# Patient Record
Sex: Female | Born: 1985 | Hispanic: Yes | Smoking: Never smoker
Health system: Southern US, Community
[De-identification: ages and names within clinical notes are randomized; demographics above are authoritative.]

## PROBLEM LIST (undated history)

## (undated) DIAGNOSIS — K219 Gastro-esophageal reflux disease without esophagitis: Secondary | ICD-10-CM

## (undated) DIAGNOSIS — N12 Tubulo-interstitial nephritis, not specified as acute or chronic: Secondary | ICD-10-CM

## (undated) DIAGNOSIS — K529 Noninfective gastroenteritis and colitis, unspecified: Secondary | ICD-10-CM

## (undated) DIAGNOSIS — K2970 Gastritis, unspecified, without bleeding: Secondary | ICD-10-CM

## (undated) HISTORY — DX: Gastritis, unspecified, without bleeding: K29.70

## (undated) HISTORY — PX: NO PAST SURGERIES: SHX2092

## (undated) HISTORY — DX: Noninfective gastroenteritis and colitis, unspecified: K52.9

## (undated) HISTORY — DX: Gastro-esophageal reflux disease without esophagitis: K21.9

## (undated) HISTORY — DX: Tubulo-interstitial nephritis, not specified as acute or chronic: N12

---

## 2014-12-12 ENCOUNTER — Ambulatory Visit: Payer: Self-pay | Attending: Internal Medicine | Admitting: Internal Medicine

## 2014-12-12 ENCOUNTER — Encounter: Payer: Self-pay | Admitting: Internal Medicine

## 2014-12-12 VITALS — BP 102/68 | HR 71 | Temp 97.9°F | Resp 16 | Ht 61.0 in | Wt 107.0 lb

## 2014-12-12 DIAGNOSIS — Z309 Encounter for contraceptive management, unspecified: Secondary | ICD-10-CM

## 2014-12-12 DIAGNOSIS — Z3009 Encounter for other general counseling and advice on contraception: Secondary | ICD-10-CM | POA: Insufficient documentation

## 2014-12-12 DIAGNOSIS — Z0000 Encounter for general adult medical examination without abnormal findings: Secondary | ICD-10-CM | POA: Insufficient documentation

## 2014-12-12 LAB — LIPID PANEL
CHOLESTEROL: 119 mg/dL (ref 0–200)
HDL: 60 mg/dL (ref >39)
LDL Cholesterol: 48 mg/dL (ref 0–99)
Total CHOL/HDL Ratio: 2 Ratio
Triglycerides: 54 mg/dL (ref <150)
VLDL: 11 mg/dL (ref 0–40)

## 2014-12-12 LAB — COMPLETE METABOLIC PANEL WITH GFR
ALT: 33 U/L (ref 0–35)
AST: 30 U/L (ref 0–37)
Albumin: 4.4 g/dL (ref 3.5–5.2)
Alkaline Phosphatase: 53 U/L (ref 39–117)
BUN: 7 mg/dL (ref 6–23)
CHLORIDE: 102 meq/L (ref 96–112)
CO2: 27 mEq/L (ref 19–32)
Calcium: 9.3 mg/dL (ref 8.4–10.5)
Creat: 0.52 mg/dL (ref 0.50–1.10)
GFR, Est Non African American: >89 mL/min
GLUCOSE: 74 mg/dL (ref 70–99)
Potassium: 4.2 mEq/L (ref 3.5–5.3)
Sodium: 137 mEq/L (ref 135–145)
Total Bilirubin: 0.7 mg/dL (ref 0.2–1.2)
Total Protein: 7.3 g/dL (ref 6.0–8.3)

## 2014-12-12 LAB — POCT URINALYSIS DIPSTICK
BILIRUBIN UA: NEGATIVE
GLUCOSE UA: NEGATIVE
Ketones, UA: 15
Leukocytes, UA: NEGATIVE
NITRITE UA: NEGATIVE
PROTEIN UA: NEGATIVE
SPEC GRAV UA: 1.015
UROBILINOGEN UA: 0.2
pH, UA: 6.5

## 2014-12-12 LAB — CBC
HEMATOCRIT: 35.4 % — AB (ref 36.0–46.0)
HEMOGLOBIN: 12.1 g/dL (ref 12.0–15.0)
MCH: 29.7 pg (ref 26.0–34.0)
MCHC: 34.2 g/dL (ref 30.0–36.0)
MCV: 87 fL (ref 78.0–100.0)
MPV: 9.9 fL (ref 8.6–12.4)
PLATELETS: 248 10*3/uL (ref 150–400)
RBC: 4.07 MIL/uL (ref 3.87–5.11)
RDW: 13.4 % (ref 11.5–15.5)
WBC: 4.6 10*3/uL (ref 4.0–10.5)

## 2014-12-12 LAB — POCT URINE PREGNANCY: Preg Test, Ur: NEGATIVE

## 2014-12-12 MED ORDER — MEDROXYPROGESTERONE ACETATE 150 MG/ML IM SUSP
150.0000 mg | Freq: Once | INTRAMUSCULAR | Status: AC
  Administered 2014-12-12: 150 mg via INTRAMUSCULAR

## 2014-12-12 NOTE — Progress Notes (Signed)
Patient ID: Lisa Lin, female   DOB: 1986-01-30, 29 y.o.   MRN: [id number]  NID:[id number]  RWE:[id number]  DOB - Dec 16, 1985  CC:  Chief Complaint  Patient presents with  . Establish Care       HPI: Lisa Lin is a 29 y.o. female here today to establish medical care.  Patient has no past medical history.  She currently has three children.  She reports that her last period was the middle of December and it is irregular.  She reports that her last pap smear was in [place] last year and it was abnormal, in which her Colpo came back negative. Has history of ovarian cyst. She was told she needs to have pap smears every 6 months but she has not followed up since then.    Surgical Hx: none Social Hx: denies tobacco, alcohol, and drug use.  Unmarried with 3 childrens.   Patient has No headache, No chest pain, No abdominal pain - No Nausea, No new weakness tingling or numbness, No Cough - SOB.  Not on File History reviewed. No pertinent past medical history. No current outpatient prescriptions on file prior to visit.   No current facility-administered medications on file prior to visit.   History reviewed. No pertinent family history. History   Social History  . Marital Status: Single    Spouse Name: N/A    Number of Children: N/A  . Years of Education: N/A   Occupational History  . Not on file.   Social History Main Topics  . Smoking status: Never Smoker   . Smokeless tobacco: Not on file  . Alcohol Use: Not on file  . Drug Use: Not on file  . Sexual Activity: Not on file   Other Topics Concern  . Not on file   Social History Narrative  . No narrative on file    Review of Systems: Constitutional: Negative for fever, chills, diaphoresis, activity change, appetite change and fatigue. HENT: Negative for ear pain, nosebleeds, congestion, facial swelling, rhinorrhea, neck pain, neck stiffness and ear discharge.  Eyes: Negative for pain, discharge, redness, itching  and visual disturbance. Respiratory: Negative for cough, choking, chest tightness, shortness of breath, wheezing and stridor.  Cardiovascular: Negative for chest pain, palpitations and leg swelling. Gastrointestinal: Negative for abdominal distention. Genitourinary: Negative for dysuria, urgency, frequency, hematuria, flank pain, decreased urine volume, difficulty urinating and dyspareunia.  Musculoskeletal: Negative for back pain, joint swelling, arthralgia and gait problem. Neurological: Negative for dizziness, tremors, seizures, syncope, facial asymmetry, speech difficulty, weakness, light-headedness, numbness and headaches.  Hematological: Negative for adenopathy. Does not bruise/bleed easily. Psychiatric/Behavioral: Negative for hallucinations, behavioral problems, confusion, dysphoric mood, decreased concentration and agitation.    Objective:   Filed Vitals:   12/12/14 1029  BP: 102/68  Pulse: 71  Temp: 97.9 F (36.6 C)  Resp: 16    Physical Exam: Constitutional: Patient appears well-developed and well-nourished. No distress. HENT: Normocephalic, atraumatic, External right and left ear normal. Oropharynx is clear and moist.  Eyes: Conjunctivae and EOM are normal. PERRLA, no scleral icterus. Neck: Normal ROM. Neck supple. No JVD. No tracheal deviation. No thyromegaly. CVS: RRR, S1/S2 +, no murmurs, no gallops, no carotid bruit.  Pulmonary: Effort and breath sounds normal, no stridor, rhonchi, wheezes, rales.  Abdominal: Soft. BS +, no distension, tenderness, rebound or guarding.  Musculoskeletal: Normal range of motion. No edema and no tenderness.  Neuro: Alert. Normal reflexes, muscle tone coordination. No cranial nerve deficit. Skin: Skin is warm and dry. No  rash noted. Not diaphoretic. No erythema. No pallor. Psychiatric: Normal mood and affect. Behavior, judgment, thought content normal. Pelvic: cervix normal in appearance, external genitalia normal, no adnexal masses or  tenderness, no cervical motion tenderness and vagina normal without discharge  Breasts: normal appearance, no masses or tenderness, No nipple discharge or bleeding, No axillary or supraclavicular adenopathy    No results found for: WBC, HGB, HCT, MCV, PLT No results found for: CREATININE, BUN, NA, K, CL, CO2  No results found for: HGBA1C Lipid Panel  No results found for: CHOL, TRIG, HDL, CHOLHDL, VLDL, LDLCALC     Assessment and plan:   Rosabell was seen today for establish care.  Diagnoses and associated orders for this visit:  Annual physical exam - POCT urinalysis dipstick - Cytology - PAP [place] - Cervicovaginal ancillary only - Lipid panel - COMPLETE METABOLIC PANEL WITH GFR - CBC - TSH - Vitamin D, 25-hydroxy  Counseling for initiation of birth control method - POCT urine pregnancy - Begin medroxyPROGESTERone (DEPO-PROVERA) injection 150 mg; Inject 1 mL (150 mg total) into the muscle once.  Due to language barrier, an interpreter was present during the history-taking and subsequent discussion (and for part of the physical exam) with this patient.   Return for b/w march 26-april 9 for repeat Depo injection--RN visit.    Chari Manning, [facility] [phone] 12/12/2014, 10:59 AM

## 2014-12-12 NOTE — Patient Instructions (Signed)

## 2014-12-12 NOTE — Progress Notes (Signed)
Patient is here to establish care Patient is interested in birth control Pap smear last year was abnormal-no follow up was done this was in [place] Patient has 3 children Has had flu shot Is fasting

## 2014-12-13 LAB — VITAMIN D 25 HYDROXY (VIT D DEFICIENCY, FRACTURES): VIT D 25 HYDROXY: 11 ng/mL — AB (ref 30–100)

## 2014-12-13 LAB — TSH: TSH: 1.092 u[IU]/mL (ref 0.350–4.500)

## 2014-12-15 LAB — CYTOLOGY - PAP

## 2014-12-15 LAB — CERVICOVAGINAL ANCILLARY ONLY
Chlamydia: NEGATIVE
Neisseria Gonorrhea: NEGATIVE
WET PREP (BD AFFIRM): NEGATIVE
Wet Prep (BD Affirm): NEGATIVE
Wet Prep (BD Affirm): NEGATIVE

## 2014-12-25 ENCOUNTER — Telehealth: Payer: Self-pay | Admitting: *Deleted

## 2014-12-25 MED ORDER — ERGOCALCIFEROL 1.25 MG (50000 UT) PO CAPS: 50000.0000 [IU] | ORAL_CAPSULE | ORAL | Status: DC

## 2014-12-25 NOTE — Telephone Encounter (Signed)
-----   Message from Lance Bosch, NP sent at 12/23/2014 10:52 PM EST ----- Patient pap is negative for malignancies and infections. Will repeat in 3 years.  Labs normal except for Vitamin D is low. Please send drisdol 50,000 IU to take once weekly for 12 weeks. 12 tablets no refills.

## 2014-12-25 NOTE — Telephone Encounter (Signed)
Pt aware of resutls. Rx send to [facility] (information was given in [place])

## 2014-12-28 ENCOUNTER — Encounter: Payer: Self-pay | Admitting: Internal Medicine

## 2015-03-06 ENCOUNTER — Ambulatory Visit: Payer: Self-pay | Attending: Internal Medicine | Admitting: Internal Medicine

## 2015-03-06 DIAGNOSIS — N912 Amenorrhea, unspecified: Secondary | ICD-10-CM | POA: Insufficient documentation

## 2015-03-06 DIAGNOSIS — Z3009 Encounter for other general counseling and advice on contraception: Secondary | ICD-10-CM | POA: Insufficient documentation

## 2015-03-06 LAB — POCT URINE PREGNANCY: PREG TEST UR: NEGATIVE

## 2015-03-06 MED ORDER — MEDROXYPROGESTERONE ACETATE 150 MG/ML IM SUSP
150.0000 mg | Freq: Once | INTRAMUSCULAR | Status: AC
  Administered 2015-03-06: 150 mg via INTRAMUSCULAR

## 2015-03-06 NOTE — Progress Notes (Signed)
Spoke with patient via [employer], [place], [place] Patient presents for Depo Provera injection States feeling well Last injection received 12/12/14 Patient concerned that she has not had a menses since beginning depo and requesting urine pregnancy test Discussed with pt that amenorrhea is a common side effect to depo  Urine pregnancy: negative  Next injection due 6/17-06/05/2015

## 2015-03-24 ENCOUNTER — Encounter: Payer: Self-pay | Admitting: Internal Medicine

## 2015-03-24 NOTE — Progress Notes (Signed)
I have read and agree with nursing assessment and plan  Chari Manning, NP 03/24/2015 1:14 PM

## 2015-05-22 ENCOUNTER — Ambulatory Visit: Payer: Self-pay | Attending: Internal Medicine | Admitting: *Deleted

## 2015-05-22 VITALS — Wt 113.4 lb

## 2015-05-22 DIAGNOSIS — Z308 Encounter for other contraceptive management: Secondary | ICD-10-CM | POA: Insufficient documentation

## 2015-05-22 MED ORDER — MEDROXYPROGESTERONE ACETATE 150 MG/ML IM SUSP
150.0000 mg | Freq: Once | INTRAMUSCULAR | Status: AC
  Administered 2015-05-22: 150 mg via INTRAMUSCULAR

## 2015-05-22 NOTE — Progress Notes (Signed)
Patient presents for Depo Provera injection States feeling well Last injection received 03/06/15 Next injection due 9/2 - 08/21/2015 Patient concerned about weight gain since starting Depo Provera Patient considering alternative contraception due to weight gain Todays' WT 113.4 lb WT 107 on 12/12/14 Encouraged patient to schedule appt with PCP to discuss options

## 2015-06-22 ENCOUNTER — Ambulatory Visit: Payer: Self-pay | Attending: Internal Medicine

## 2015-08-07 ENCOUNTER — Ambulatory Visit: Payer: Self-pay

## 2015-08-27 ENCOUNTER — Ambulatory Visit: Payer: Self-pay | Attending: Internal Medicine

## 2015-08-27 DIAGNOSIS — Z30013 Encounter for initial prescription of injectable contraceptive: Secondary | ICD-10-CM | POA: Insufficient documentation

## 2015-08-27 LAB — POCT URINE PREGNANCY: PREG TEST UR: NEGATIVE

## 2015-08-27 MED ORDER — MEDROXYPROGESTERONE ACETATE 150 MG/ML IM SUSP
150.0000 mg | Freq: Once | INTRAMUSCULAR | Status: AC
  Administered 2015-08-27: 150 mg via INTRAMUSCULAR

## 2015-08-27 NOTE — Progress Notes (Signed)
Patient here for her depo injection Last injection was 6/17 Patient missed the window date to return so per Protocol we did a urine pregnancy which was negative Here in the office Patient to return between 12/8 and 12/22 for her next injection

## 2015-08-27 NOTE — Patient Instructions (Signed)
Return between December 8 and December 22 for your next depo injection

## 2015-11-13 ENCOUNTER — Ambulatory Visit: Payer: Self-pay | Attending: Internal Medicine | Admitting: *Deleted

## 2015-11-13 DIAGNOSIS — Z309 Encounter for contraceptive management, unspecified: Secondary | ICD-10-CM | POA: Insufficient documentation

## 2015-11-13 MED ORDER — MEDROXYPROGESTERONE ACETATE 150 MG/ML IM SUSP
150.0000 mg | Freq: Once | INTRAMUSCULAR | Status: AC
  Administered 2015-11-13: 150 mg via INTRAMUSCULAR

## 2015-11-13 NOTE — Progress Notes (Signed)
Patient here for her depo injection Last injection was 08/27/2015 Pt within window (12/8 and 12/22) Per protocol Depo given today. Advised to return between Jan 28, 2015-March 10,2017

## 2015-11-13 NOTE — Patient Instructions (Signed)
Return between Jan 28, 2015-March 10,2017

## 2015-12-22 ENCOUNTER — Ambulatory Visit: Payer: Self-pay | Attending: Internal Medicine

## 2016-02-19 ENCOUNTER — Ambulatory Visit: Payer: Self-pay | Attending: Internal Medicine

## 2016-02-19 VITALS — BP 122/78 | HR 70 | Temp 98.0°F | Resp 16

## 2016-02-19 DIAGNOSIS — Z3042 Encounter for surveillance of injectable contraceptive: Secondary | ICD-10-CM

## 2016-02-19 LAB — POCT URINE PREGNANCY: Preg Test, Ur: NEGATIVE

## 2016-02-19 MED ORDER — MEDROXYPROGESTERONE ACETATE 150 MG/ML IM SUSP
150.0000 mg | Freq: Once | INTRAMUSCULAR | Status: AC
  Administered 2016-02-19: 150 mg via INTRAMUSCULAR

## 2016-02-19 NOTE — Patient Instructions (Signed)
Return back to office for your next Depo shot between June 2nd and June 16

## 2016-02-19 NOTE — Progress Notes (Unsigned)
Patient here for her next depo injection Patient was supposed to return  Between 2/24 and 3/10 Patient has missed the return window Per office policy we will run a urine pregnancy test

## 2016-05-13 ENCOUNTER — Ambulatory Visit: Payer: Self-pay | Attending: Internal Medicine

## 2016-05-13 DIAGNOSIS — Z309 Encounter for contraceptive management, unspecified: Secondary | ICD-10-CM

## 2016-05-13 MED ORDER — MEDROXYPROGESTERONE ACETATE 150 MG/ML IM SUSP
150.0000 mg | Freq: Once | INTRAMUSCULAR | Status: AC
  Administered 2016-05-13: 150 mg via INTRAMUSCULAR

## 2016-05-13 NOTE — Patient Instructions (Addendum)
La proxima Inj Depo Porvera debe de ser Lisa Lin 27 a Setiembre 7 Next Depo Provera Inj can be done between Aug 25- Sep 8

## 2016-05-13 NOTE — Progress Notes (Signed)
Patient here for her next depo injection Last Depo Inj done on February 19 2016 Pt within window  Depo given today without negative reaction

## 2016-07-20 ENCOUNTER — Ambulatory Visit: Payer: Self-pay | Attending: Internal Medicine

## 2016-08-16 ENCOUNTER — Ambulatory Visit: Payer: Self-pay | Attending: Internal Medicine

## 2016-08-16 DIAGNOSIS — Z3049 Encounter for surveillance of other contraceptives: Secondary | ICD-10-CM

## 2016-08-16 LAB — POCT URINE PREGNANCY: PREG TEST UR: NEGATIVE

## 2016-08-16 MED ORDER — MEDROXYPROGESTERONE ACETATE 150 MG/ML IM SUSP
150.0000 mg | Freq: Once | INTRAMUSCULAR | Status: AC
  Administered 2016-08-16: 150 mg via INTRAMUSCULAR

## 2016-08-16 NOTE — Patient Instructions (Signed)
Next injection due Nov 28-2017 to Nov 15, 2016

## 2016-08-16 NOTE — Progress Notes (Signed)
Patient her for Depo Injection.  [facility] Interpreter ID lorena [phone] assisted with translation. Patient here today for Depo Provera.  Received last Depo on 05/13/16 and is due for Depo today.  Depo Provera 150 mg given in RUOQ.  Due for next injection Nov 28 to November 15, 2016 .  Reminder card given to patient.  Priscille Heidelberg, RN, BSN

## 2016-11-07 ENCOUNTER — Ambulatory Visit: Payer: Self-pay | Attending: Internal Medicine | Admitting: *Deleted

## 2016-11-07 DIAGNOSIS — Z3042 Encounter for surveillance of injectable contraceptive: Secondary | ICD-10-CM

## 2016-11-07 MED ORDER — MEDROXYPROGESTERONE ACETATE 150 MG/ML IM SUSP
150.0000 mg | Freq: Once | INTRAMUSCULAR | Status: AC
  Administered 2016-11-07: 150 mg via INTRAMUSCULAR

## 2016-11-07 NOTE — Progress Notes (Signed)
Pt presents [facility] for Depo Injection. Administered Lt upper outer quadrant.

## 2017-01-06 ENCOUNTER — Ambulatory Visit: Payer: Self-pay | Attending: Internal Medicine

## 2017-02-01 ENCOUNTER — Ambulatory Visit: Payer: Self-pay | Attending: Internal Medicine | Admitting: *Deleted

## 2017-02-01 VITALS — BP 133/80

## 2017-02-01 DIAGNOSIS — Z3042 Encounter for surveillance of injectable contraceptive: Secondary | ICD-10-CM | POA: Insufficient documentation

## 2017-02-01 MED ORDER — MEDROXYPROGESTERONE ACETATE 150 MG/ML IM SUSP
150.0000 mg | Freq: Once | INTRAMUSCULAR | Status: AC
  Administered 2017-02-01: 150 mg via INTRAMUSCULAR

## 2017-02-01 NOTE — Progress Notes (Signed)
Date last pap: 12/12/14 . Last Depo-Provera:11/07/2016  Side Effects if any: none. Serum HCG indicated? no. Depo-Provera 150 mg IM given in Right upper outer quadrant by T.Maclain Cohron,RN. Pt tolerated procedure well. Next appointment due May 16 through May 30. Given reminder card. Pt verbalized understanding. Carilyn Goodpasture,  RN

## 2017-02-09 ENCOUNTER — Ambulatory Visit: Payer: Self-pay | Attending: Family Medicine | Admitting: Family Medicine

## 2017-02-09 VITALS — BP 106/71 | HR 83 | Temp 98.2°F | Resp 18 | Ht 61.0 in | Wt 110.6 lb

## 2017-02-09 DIAGNOSIS — K219 Gastro-esophageal reflux disease without esophagitis: Secondary | ICD-10-CM | POA: Insufficient documentation

## 2017-02-09 DIAGNOSIS — Z7689 Persons encountering health services in other specified circumstances: Secondary | ICD-10-CM | POA: Insufficient documentation

## 2017-02-09 DIAGNOSIS — Z79899 Other long term (current) drug therapy: Secondary | ICD-10-CM | POA: Insufficient documentation

## 2017-02-09 DIAGNOSIS — R1013 Epigastric pain: Secondary | ICD-10-CM | POA: Insufficient documentation

## 2017-02-09 DIAGNOSIS — R1310 Dysphagia, unspecified: Secondary | ICD-10-CM | POA: Insufficient documentation

## 2017-02-09 LAB — CBC WITH DIFFERENTIAL/PLATELET
BASOS PCT: 0 %
Basophils Absolute: 0 cells/uL (ref 0–200)
EOS PCT: 2 %
Eosinophils Absolute: 108 cells/uL (ref 15–500)
HCT: 39.8 % (ref 35.0–45.0)
HEMOGLOBIN: 13.5 g/dL (ref 11.7–15.5)
LYMPHS ABS: 1620 {cells}/uL (ref 850–3900)
Lymphocytes Relative: 30 %
MCH: 31 pg (ref 27.0–33.0)
MCHC: 33.9 g/dL (ref 32.0–36.0)
MCV: 91.3 fL (ref 80.0–100.0)
MONOS PCT: 7 %
MPV: 10.3 fL (ref 7.5–12.5)
Monocytes Absolute: 378 cells/uL (ref 200–950)
NEUTROS ABS: 3294 {cells}/uL (ref 1500–7800)
Neutrophils Relative %: 61 %
PLATELETS: 260 10*3/uL (ref 140–400)
RBC: 4.36 MIL/uL (ref 3.80–5.10)
RDW: 13.2 % (ref 11.0–15.0)
WBC: 5.4 10*3/uL (ref 3.8–10.8)

## 2017-02-09 LAB — LIPASE: Lipase: 25 U/L (ref 7–60)

## 2017-02-09 LAB — COMPLETE METABOLIC PANEL WITH GFR
ALT: 14 U/L (ref 6–29)
AST: 17 U/L (ref 10–30)
Albumin: 4.6 g/dL (ref 3.6–5.1)
Alkaline Phosphatase: 64 U/L (ref 33–115)
BILIRUBIN TOTAL: 0.4 mg/dL (ref 0.2–1.2)
BUN: 7 mg/dL (ref 7–25)
CO2: 23 mmol/L (ref 20–31)
CREATININE: 0.7 mg/dL (ref 0.50–1.10)
Calcium: 9.3 mg/dL (ref 8.6–10.2)
Chloride: 105 mmol/L (ref 98–110)
GFR, Est African American: >89 mL/min (ref >=60)
GFR, Est Non African American: >89 mL/min (ref >=60)
Glucose, Bld: 118 mg/dL — ABNORMAL HIGH (ref 65–99)
Potassium: 3.8 mmol/L (ref 3.5–5.3)
SODIUM: 140 mmol/L (ref 135–146)
TOTAL PROTEIN: 7.5 g/dL (ref 6.1–8.1)

## 2017-02-09 MED ORDER — OMEPRAZOLE 20 MG PO CPDR: 20.0000 mg | DELAYED_RELEASE_CAPSULE | Freq: Every day | ORAL | 1 refills | Status: DC

## 2017-02-09 NOTE — Progress Notes (Signed)
Patient is here for gastritis  Patient stated that she has pain in her stomach its even more painful after eating but the pain is always there it feels like a burning sensation sometimes even like cramps

## 2017-02-09 NOTE — Progress Notes (Signed)
   Subjective:  Patient ID: Lisa Lin, female    DOB: 02/01/1986  Age: 31 y.o. MRN: [id number]  CC: [name] presents for   Gastritis: Reports 2 years history of stomach pain worse after she eats. Reports heartburn, nausea, and  . Denies coffee ground emesis or melena. Denies taking anything for symptoms.   Outpatient Medications Prior to Visit  Medication Sig Dispense Refill  . ergocalciferol (VITAMIN D2) 50000 UNITS capsule Take 1 capsule (50,000 Units total) by mouth once a week. 12 capsule 0  . medroxyPROGESTERone (DEPO-PROVERA) 150 MG/ML injection Inject 150 mg into the muscle every 3 (three) months.     No facility-administered medications prior to visit.     ROS Review of Systems  Constitutional: Negative.   HENT: Negative.   Respiratory: Negative.   Cardiovascular: Negative.   Gastrointestinal: Positive for abdominal pain and nausea.    Objective:  BP 106/71 (BP Location: Left Arm, Patient Position: Sitting, Cuff Size: Normal)   Pulse 83   Temp 98.2 F (36.8 C) (Oral)   Resp 18   Ht 5\' 1"  (1.549 m)   Wt 110 lb 9.6 oz (50.2 kg)   SpO2 98%   BMI 20.90 kg/m   BP/Weight 02/09/2017 02/01/2017 0/25/4270  Systolic BP 623 762 831  Diastolic BP 71 80 78  Wt. (Lbs) 110.6 - -  BMI 20.9 - -   Physical Exam  HENT:  Head: Normocephalic and atraumatic.  Right Ear: External ear normal.  Left Ear: External ear normal.  Nose: Nose normal.  Mouth/Throat: Oropharynx is clear and moist.  Eyes: Pupils are equal, round, and reactive to light.  Neck: Normal range of motion. Neck supple.  Cardiovascular: Normal rate, regular rhythm, normal heart sounds and intact distal pulses.   Pulmonary/Chest: Effort normal and breath sounds normal.  Abdominal: Soft. Bowel sounds are normal. There is tenderness (epigastric pain ).  Lymphadenopathy:    She has no cervical adenopathy.  Nursing note and vitals reviewed.  Assessment & Plan:   Problem  List Items Addressed This Visit    None    Visit Diagnoses    Gastroesophageal reflux disease, esophagitis presence not specified    -  Primary   Relevant Medications   omeprazole (PRILOSEC) 20 MG capsule   Other Relevant Orders   H. pylori breath test (Completed)   Epigastric pain       Relevant Orders   CBC with Differential (Completed)   COMPLETE METABOLIC PANEL WITH GFR (Completed)   Lipase (Completed)   Dysphagia, unspecified type       Relevant Orders   Ambulatory referral to Gastroenterology      Meds ordered this encounter  Medications  . omeprazole (PRILOSEC) 20 MG capsule    Sig: Take 1 capsule (20 mg total) by mouth daily.    Dispense:  30 capsule    Refill:  1    Order Specific Question:   Supervising Provider    Answer:   Tresa Garter [id number]    Follow-up: Return in about 6 weeks (around 03/23/2017) for GERD.   Alfonse Spruce FNP

## 2017-02-09 NOTE — Patient Instructions (Signed)
Get paperwork and apply for orange card to complete referral process.    Opciones de alimentos para pacientes con reflujo gastroesofgico - Adultos (Food Choices for Gastroesophageal Reflux Disease, Adult) Cuando se tiene reflujo gastroesofgico (ERGE), los alimentos que se ingieren y los hbitos de alimentacin son muy importantes. Elegir los alimentos adecuados puede ayudar a [employer]. [place]?  Elija las frutas, los vegetales, los cereales integrales y los productos lcteos con bajo contenido de [place].  [place], de pescado y de ave con bajo contenido de grasas.  Limite las grasas, como los [place], los aderezos para [place], la [place], los frutos secos y Publishing copy.  Lleve un registro de alimentos. Esto ayuda a identificar los alimentos que ocasionan sntomas.  Evite los alimentos que le ocasionen sntomas. Pueden ser distintos para cada persona.  Haga comidas pequeas durante Psychiatrist de 3 comidas abundantes.  Coma lentamente, en un lugar donde est distendido.  Limite el consumo de alimentos fritos.  Cocine los alimentos utilizando mtodos que no sean la fritura.  Evite el consumo alcohol.  Evite beber grandes cantidades de lquidos con las comidas.  Evite agacharse o recostarse hasta despus de 2 o 3horas de haber comido. QU ALIMENTOS NO SE RECOMIENDAN? Estos son algunos alimentos y bebidas que pueden empeorar los sntomas: Astronomer. Jugo de tomate. Salsa de tomate y espagueti. Ajes. [place] y [place]. Rbano picante. Frutas [place]aran[place], pomelos y limn (fruta y [place]). Carnes Carnes de [place], de pescado y de ave con gran contenido de grasas. Esto incluye los perros calientes, las [place], el [place], la salchicha, el salame y el tocino. Lcteos Leche entera y [place]. [employer]. Crema. [place]. Helados. Queso crema. Bebidas T o caf. Bebidas gaseosas o bebidas  energizantes. Condimentos Salsa picante. Salsa barbacoa. Dulces/postres Chocolate y cacao. [place]. Menta y mentol. Grasas y [employer]. Esto incluye las papas fritas. Otros [place]. Especias picantes. Esto incluye la pimienta negra, la pimienta blanca, la pimienta roja, la pimienta de cayena, el curry en polvo, los clavos de [place], el jengibre y el [place] en polvo. Esta no es [employer] de los alimentos y las bebidas que se Higher education careers adviser. Comunquese con el nutricionista para recibir ms informacin. Esta informacin no tiene Marine scientist el consejo del mdico. Asegrese de hacerle al mdico cualquier pregunta que tenga. Document Released: 05/22/2012 Document Revised: 12/12/2014 Document Reviewed: 09/25/2013 Elsevier Interactive Patient Education  2017 [employer].

## 2017-02-10 LAB — H. PYLORI BREATH TEST: H. pylori Breath Test: NOT DETECTED

## 2017-02-13 ENCOUNTER — Telehealth: Payer: Self-pay

## 2017-02-13 NOTE — Telephone Encounter (Signed)
-----   Message from Alfonse Spruce, [place] sent at 02/13/2017 11:24 AM EDT ----- Labs were normal. Kidney function normal Liver function normal. Lipase lab is normal. Elevated lipase can indicate pancreatitis.  H.pylori is negative. H.pylori is a bacteria that can infect the stomach and cause stomach ulcers. Follow up with gastroenterology referral

## 2017-02-14 NOTE — Telephone Encounter (Signed)
-----   Message from Alfonse Spruce, [place] sent at 02/13/2017 11:24 AM EDT ----- Labs were normal. Kidney function normal Liver function normal. Lipase lab is normal. Elevated lipase can indicate pancreatitis.  H.pylori is negative. H.pylori is a bacteria that can infect the stomach and cause stomach ulcers. Follow up with gastroenterology referral

## 2017-02-14 NOTE — Telephone Encounter (Signed)
Patient return CMA call   CMA inform patient about her lab results  Patient was aware and understood

## 2017-02-17 ENCOUNTER — Ambulatory Visit: Payer: Self-pay | Attending: Family Medicine

## 2017-03-23 ENCOUNTER — Ambulatory Visit: Payer: Self-pay | Attending: Family Medicine | Admitting: Family Medicine

## 2017-03-23 ENCOUNTER — Encounter: Payer: Self-pay | Admitting: Nurse Practitioner

## 2017-03-23 VITALS — BP 97/64 | HR 71 | Temp 98.2°F | Resp 18 | Ht 60.0 in | Wt 111.0 lb

## 2017-03-23 DIAGNOSIS — K219 Gastro-esophageal reflux disease without esophagitis: Secondary | ICD-10-CM | POA: Insufficient documentation

## 2017-03-23 DIAGNOSIS — Z79899 Other long term (current) drug therapy: Secondary | ICD-10-CM | POA: Insufficient documentation

## 2017-03-23 DIAGNOSIS — R1310 Dysphagia, unspecified: Secondary | ICD-10-CM | POA: Insufficient documentation

## 2017-03-23 MED ORDER — OMEPRAZOLE 20 MG PO CPDR: 20.0000 mg | DELAYED_RELEASE_CAPSULE | Freq: Two times a day (BID) | ORAL | 0 refills | Status: DC

## 2017-03-23 NOTE — Progress Notes (Signed)
   Subjective:  Patient ID: Lisa Lin, female    DOB: 13-Feb-1986  Age: 31 y.o. MRN: [id number]  CC: [place] presents for   GERD:History 2 years history of stomach pain with heartburn and nausea that is worsened after meals. Denies coffee ground emesis or melena. Denies taking anything for symptoms. H.pylori was negative. Was prescribed a course of PPI. Reports adherence with minimal relief of symptoms.   No facility-administered medications prior to visit.    Outpatient Medications Prior to Visit  Medication Sig Dispense Refill  . ergocalciferol (VITAMIN D2) 50000 UNITS capsule Take 1 capsule (50,000 Units total) by mouth once a week. (Patient not taking: Reported on 03/27/2017) 12 capsule 0  . medroxyPROGESTERone (DEPO-PROVERA) 150 MG/ML injection Inject 150 mg into the muscle every 3 (three) months.    Marland Kitchen omeprazole (PRILOSEC) 20 MG capsule Take 1 capsule (20 mg total) by mouth daily. 30 capsule 1    ROS Review of Systems  Constitutional: Negative.   Respiratory: Negative.   Cardiovascular: Negative.   Gastrointestinal:       GERD    Objective:  BP 97/64 (BP Location: Left Arm, Patient Position: Sitting, Cuff Size: Normal)   Pulse 71   Temp 98.2 F (36.8 C) (Oral)   Resp 18   Ht 5' (1.524 m)   Wt 111 lb (50.3 kg)   SpO2 97%   BMI 21.68 kg/m   BP/Weight 03/28/2017 03/27/2017 5/83/0940  Systolic BP 768 - 97  Diastolic BP 70 - 64  Wt. (Lbs) 110.67 - 111  BMI - 21.61 21.68   Physical Exam  Constitutional: She appears well-developed and well-nourished.  HENT:  Head: Normocephalic.  Right Ear: External ear normal.  Left Ear: External ear normal.  Nose: Nose normal.  Mouth/Throat: Oropharynx is clear and moist.  Eyes: Conjunctivae are normal. Pupils are equal, round, and reactive to light.  Neck: Normal range of motion. Neck supple. No JVD present.  Cardiovascular: Normal rate, regular rhythm, normal heart sounds and intact  distal pulses.   Pulmonary/Chest: Effort normal and breath sounds normal.  Abdominal: Soft. Bowel sounds are normal.  Lymphadenopathy:    She has no cervical adenopathy.  Skin: Skin is warm and dry.  Nursing note and vitals reviewed.  Assessment & Plan:   Problem List Items Addressed This Visit    None    Visit Diagnoses    Chronic GERD    -  Primary   Relevant Medications   omeprazole (PRILOSEC) 20 MG capsule   Other Relevant Orders   Ambulatory referral to Gastroenterology   Dysphagia, unspecified type       Relevant Orders   Ambulatory referral to Gastroenterology   Gastroesophageal reflux disease, esophagitis presence not specified       Relevant Medications   omeprazole (PRILOSEC) 20 MG capsule      Meds ordered this encounter  Medications  . omeprazole (PRILOSEC) 20 MG capsule    Sig: Take 1 capsule (20 mg total) by mouth 2 (two) times daily before a meal.    Dispense:  120 capsule    Refill:  0    Order Specific Question:   Supervising Provider    Answer:   Tresa Garter [[id number]]    Follow-up: Return in about 2 months (around 05/23/2017) for GERD.   Alfonse Spruce FNP

## 2017-03-23 NOTE — Progress Notes (Signed)
Patient is here for f/up 6 weeks  Patient denies pain for today  Patient has not taking her current medication for today  Patient has not eaten for today

## 2017-03-23 NOTE — Patient Instructions (Signed)
Apply for the orange card to complete referral process if you do not have one.  Soft-Food Meal Plan A soft-food meal plan includes foods that are safe and easy to swallow. This meal plan typically is used:  If you are having trouble chewing or swallowing foods.  As a transition meal plan after only having had liquid meals for a long period. What do I need to know about the soft-food meal plan? A soft-food meal plan includes tender foods that are soft and easy to chew and swallow. In most cases, bite-sized pieces of food are easier to swallow. A bite-sized piece is about  inch or smaller. Foods in this plan do not need to be ground or pureed. Foods that are very hard, crunchy, or sticky should be avoided. Also, breads, cereals, yogurts, and desserts with nuts, seeds, or fruits should be avoided. What foods can I eat? Grains  Rice and wild rice. Moist bread, dressing, pasta, and noodles. Well-moistened dry or cooked cereals, such as farina (cooked wheat cereal), oatmeal, or grits. Biscuits, breads, muffins, pancakes, and waffles that have been well moistened. Vegetables  Shredded lettuce. Cooked, tender vegetables, including potatoes without skins. Vegetable juices. Broths or creamed soups made with vegetables that are not stringy or chewy. Strained tomatoes (without seeds). Fruits  Canned or well-cooked fruits. Soft (ripe), peeled fresh fruits, such as peaches, nectarines, kiwi, cantaloupe, honeydew melon, and watermelon (without seeds). Soft berries with small seeds, such as strawberries. Fruit juices (without pulp). Meats and Other Protein Sources  Moist, tender, lean beef. Mutton. Lamb. Veal. Chicken. [place]. Liver. Ham. Fish without bones. Eggs. Dairy  Milk, milk drinks, and cream. Plain cream cheese and cottage cheese. Plain yogurt. Sweets/Desserts  Flavored gelatin desserts. Custard. Plain ice cream, frozen yogurt, sherbet, milk shakes, and malts. Plain cakes and cookies. Plain hard  candy. Other  Butter, margarine (without trans fat), and cooking oils. Mayonnaise. Cream sauces. Mild spices, salt, and sugar. Syrup, molasses, honey, and jelly. The items listed above may not be a complete list of recommended foods or beverages. Contact your dietitian for more options.  What foods are not recommended? Grains  Dry bread, toast, crackers that have not been moistened. Coarse or dry cereals, such as bran, granola, and shredded wheat. Tough or chewy crusty breads, such as [place] bread or baguettes. Vegetables  Corn. Raw vegetables except shredded lettuce. Cooked vegetables that are tough or stringy. Tough, crisp, fried potatoes and potato skins. Fruits  Fresh fruits with skins or seeds or both, such as apples, pears, or grapes. Stringy, high-pulp fruits, such as papaya, pineapple, coconut, or mango. Fruit leather, fruit roll-ups, and all dried fruits. Meats and Other Protein Sources  Sausages and hot dogs. Meats with gristle. Fish with bones. Nuts, seeds, and chunky peanut or other nut butters. Sweets/Desserts  Cakes or cookies that are very dry or chewy. The items listed above may not be a complete list of foods and beverages to avoid. Contact your dietitian for more information.  This information is not intended to replace advice given to you by your health care provider. Make sure you discuss any questions you have with your health care provider. Document Released: 02/28/2008 Document Revised: 04/28/2016 Document Reviewed: 10/18/2013 Elsevier Interactive Patient Education  2017 [employer].

## 2017-03-27 ENCOUNTER — Encounter (HOSPITAL_COMMUNITY): Payer: Self-pay | Admitting: *Deleted

## 2017-03-27 ENCOUNTER — Telehealth: Payer: Self-pay | Admitting: *Deleted

## 2017-03-27 ENCOUNTER — Inpatient Hospital Stay (HOSPITAL_COMMUNITY)
Admission: EM | Admit: 2017-03-27 | Discharge: 2017-03-30 | DRG: 690 | Disposition: A | Discharge status: Home / Self Care | Payer: Self-pay | Attending: Internal Medicine | Admitting: Internal Medicine

## 2017-03-27 ENCOUNTER — Emergency Department (HOSPITAL_COMMUNITY): Payer: Self-pay

## 2017-03-27 DIAGNOSIS — Z79899 Other long term (current) drug therapy: Secondary | ICD-10-CM

## 2017-03-27 DIAGNOSIS — E876 Hypokalemia: Secondary | ICD-10-CM | POA: Diagnosis not present

## 2017-03-27 DIAGNOSIS — N12 Tubulo-interstitial nephritis, not specified as acute or chronic: Secondary | ICD-10-CM | POA: Diagnosis present

## 2017-03-27 DIAGNOSIS — Z1611 Resistance to penicillins: Secondary | ICD-10-CM | POA: Diagnosis present

## 2017-03-27 DIAGNOSIS — B9620 Unspecified Escherichia coli [E. coli] as the cause of diseases classified elsewhere: Secondary | ICD-10-CM | POA: Diagnosis present

## 2017-03-27 DIAGNOSIS — N10 Acute tubulo-interstitial nephritis: Principal | ICD-10-CM | POA: Diagnosis present

## 2017-03-27 DIAGNOSIS — K219 Gastro-esophageal reflux disease without esophagitis: Secondary | ICD-10-CM | POA: Diagnosis present

## 2017-03-27 LAB — COMPREHENSIVE METABOLIC PANEL
ALBUMIN: 4.3 g/dL (ref 3.5–5.0)
ALT: 14 U/L (ref 14–54)
AST: 19 U/L (ref 15–41)
Alkaline Phosphatase: 64 U/L (ref 38–126)
Anion gap: 10 (ref 5–15)
BILIRUBIN TOTAL: 1.3 mg/dL — AB (ref 0.3–1.2)
BUN: 8 mg/dL (ref 6–20)
CO2: 24 mmol/L (ref 22–32)
Calcium: 9.2 mg/dL (ref 8.9–10.3)
Chloride: 105 mmol/L (ref 101–111)
Creatinine, Ser: 0.54 mg/dL (ref 0.44–1.00)
GFR calc Af Amer: >60 mL/min (ref >60)
GFR calc non Af Amer: >60 mL/min (ref >60)
GLUCOSE: 93 mg/dL (ref 65–99)
POTASSIUM: 4 mmol/L (ref 3.5–5.1)
Sodium: 139 mmol/L (ref 135–145)
TOTAL PROTEIN: 7.7 g/dL (ref 6.5–8.1)

## 2017-03-27 LAB — DIFFERENTIAL
Basophils Absolute: 0 10*3/uL (ref 0.0–0.1)
Basophils Relative: 0 %
Eosinophils Absolute: 0 10*3/uL (ref 0.0–0.7)
Eosinophils Relative: 0 %
Lymphocytes Relative: 7 %
Lymphs Abs: 0.7 10*3/uL (ref 0.7–4.0)
Monocytes Absolute: 0.8 10*3/uL (ref 0.1–1.0)
Monocytes Relative: 8 %
Neutro Abs: 8.9 10*3/uL — ABNORMAL HIGH (ref 1.7–7.7)
Neutrophils Relative %: 85 %

## 2017-03-27 LAB — CBC
HCT: 38.5 % (ref 36.0–46.0)
Hemoglobin: 12.8 g/dL (ref 12.0–15.0)
MCH: 30.3 pg (ref 26.0–34.0)
MCHC: 33.2 g/dL (ref 30.0–36.0)
MCV: 91.2 fL (ref 78.0–100.0)
Platelets: 201 10*3/uL (ref 150–400)
RBC: 4.22 MIL/uL (ref 3.87–5.11)
RDW: 12.7 % (ref 11.5–15.5)
WBC: 10.2 10*3/uL (ref 4.0–10.5)

## 2017-03-27 LAB — URINALYSIS, ROUTINE W REFLEX MICROSCOPIC
BILIRUBIN URINE: NEGATIVE
Glucose, UA: NEGATIVE mg/dL
Ketones, ur: 5 mg/dL — AB
NITRITE: NEGATIVE
Protein, ur: 30 mg/dL — AB
Specific Gravity, Urine: 1.013 (ref 1.005–1.030)
pH: 6 (ref 5.0–8.0)

## 2017-03-27 LAB — I-STAT BETA HCG BLOOD, ED (MC, WL, AP ONLY): I-stat hCG, quantitative: <5 m[IU]/mL (ref <5)

## 2017-03-27 LAB — LIPASE, BLOOD: Lipase: 21 U/L (ref 11–51)

## 2017-03-27 MED ORDER — ONDANSETRON HCL 4 MG/2ML IJ SOLN
4.0000 mg | Freq: Once | INTRAMUSCULAR | Status: AC
  Administered 2017-03-27: 4 mg via INTRAVENOUS
  Filled 2017-03-27: qty 2

## 2017-03-27 MED ORDER — PANTOPRAZOLE SODIUM 40 MG PO TBEC
40.0000 mg | DELAYED_RELEASE_TABLET | Freq: Every day | ORAL | Status: DC
  Administered 2017-03-28 – 2017-03-30 (×3): 40 mg via ORAL
  Filled 2017-03-27 (×3): qty 1

## 2017-03-27 MED ORDER — ENOXAPARIN SODIUM 40 MG/0.4ML ~~LOC~~ SOLN
40.0000 mg | SUBCUTANEOUS | Status: DC
  Administered 2017-03-27 – 2017-03-29 (×3): 40 mg via SUBCUTANEOUS
  Filled 2017-03-27 (×3): qty 0.4

## 2017-03-27 MED ORDER — ACETAMINOPHEN 325 MG PO TABS
650.0000 mg | ORAL_TABLET | Freq: Four times a day (QID) | ORAL | Status: DC | PRN
  Administered 2017-03-27 – 2017-03-28 (×3): 650 mg via ORAL
  Filled 2017-03-27 (×3): qty 2

## 2017-03-27 MED ORDER — HYDROMORPHONE HCL 1 MG/ML IJ SOLN
0.5000 mg | Freq: Once | INTRAMUSCULAR | Status: AC
  Administered 2017-03-27: 0.5 mg via INTRAVENOUS
  Filled 2017-03-27: qty 1

## 2017-03-27 MED ORDER — ONDANSETRON HCL 4 MG PO TABS
4.0000 mg | ORAL_TABLET | Freq: Four times a day (QID) | ORAL | Status: DC | PRN
  Administered 2017-03-30: 4 mg via ORAL
  Filled 2017-03-27: qty 1

## 2017-03-27 MED ORDER — IOPAMIDOL (ISOVUE-300) INJECTION 61%
INTRAVENOUS | Status: AC
  Administered 2017-03-27: 100 mL
  Filled 2017-03-27: qty 100

## 2017-03-27 MED ORDER — ONDANSETRON HCL 4 MG/2ML IJ SOLN
4.0000 mg | Freq: Four times a day (QID) | INTRAMUSCULAR | Status: DC | PRN
  Administered 2017-03-29: 4 mg via INTRAVENOUS
  Filled 2017-03-27: qty 2

## 2017-03-27 MED ORDER — SODIUM CHLORIDE 0.9 % IV SOLN
INTRAVENOUS | Status: AC
  Administered 2017-03-27 – 2017-03-28 (×2): via INTRAVENOUS

## 2017-03-27 MED ORDER — FENTANYL CITRATE (PF) 100 MCG/2ML IJ SOLN
25.0000 ug | Freq: Once | INTRAMUSCULAR | Status: AC
  Administered 2017-03-27: 25 ug via INTRAVENOUS
  Filled 2017-03-27: qty 2

## 2017-03-27 MED ORDER — DEXTROSE 5 % IV SOLN
1.0000 g | Freq: Once | INTRAVENOUS | Status: AC
  Administered 2017-03-27: 1 g via INTRAVENOUS
  Filled 2017-03-27: qty 10

## 2017-03-27 MED ORDER — SODIUM CHLORIDE 0.9 % IV BOLUS (SEPSIS)
1000.0000 mL | Freq: Once | INTRAVENOUS | Status: AC
  Administered 2017-03-27: 1000 mL via INTRAVENOUS

## 2017-03-27 MED ORDER — ACETAMINOPHEN 650 MG RE SUPP: 650.0000 mg | Freq: Four times a day (QID) | RECTAL | Status: DC | PRN

## 2017-03-27 MED ORDER — DEXTROSE 5 % IV SOLN
1.0000 g | INTRAVENOUS | Status: DC
  Administered 2017-03-28 – 2017-03-30 (×3): 1 g via INTRAVENOUS
  Filled 2017-03-27 (×4): qty 10

## 2017-03-27 MED ORDER — IOPAMIDOL (ISOVUE-300) INJECTION 61%
INTRAVENOUS | Status: AC
  Filled 2017-03-27: qty 30

## 2017-03-27 NOTE — H&P (Signed)
Date: 03/27/2017               Patient Name:  Lisa Lin MRN: [id number]  DOB: [birth year] Age / Sex: 31 y.o., female   PCP: Alfonse Spruce, FNP         Medical Service: Internal Medicine Teaching Service         Attending Physician: Dr. Aldine Contes, MD    First Contact: Dr. Velna Ochs Pager: [phone]  Second Contact: Dr. Zada Finders Pager: [phone]       After Hours (After 5p/  First Contact Pager: [phone]  weekends / holidays): Second Contact Pager: [phone]   Chief Complaint: abdominal pain  History of Present Illness: Lisa Lin is a 31 y.o. Spanish speaking female with no significant PMHx who presented 03/27/2017 with back pain and abdominal pain. History was obtained with the help of a translator. Patient reports dysuria about 2 weeks ago for which she did not seek medical attention and resolved on its own. Yesterday she began to have right sided lower abdominal pain with associated urinary frequency and malodorous urine. Pain radiated across her lower abdomen. She had nausea without emesis or diarrhea. Per ED nursing notes, patient was sent to the [facility] ED from Urgent Care due to concern for appendicitis.   In the ED, patient was afebrile, tachycardic to 130, non-tachypneic, normotensive 124/83, satting well on room air. No leukocytosis present but 85% neutrophils with absolute count 8.9 on differential. Mildly elevated total bilirubin 1.3 but unremarkable CMet. Lipase negative. UA had large leukocytes, negative nitrites, moderate Hgb, few bacteria, with hazy appearance. Urine cx pending. Beta hCG was negative. HIV Ab pending. CT abdomen/pelvis showed changes consistent with right urinary tract infection and early right pyelonephritis. Given dilaudid 0.5 mg x 1 fentanyl 25 mcg x 1, Zofran 4 mg x 1, NS IL bolus with NS 177mL/hr, IV ceftriazone 1 g.  Meds:  Current Meds  Medication Sig  . medroxyPROGESTERone (DEPO-PROVERA) 150 MG/ML injection Inject  150 mg into the muscle every 3 (three) months.  Marland Kitchen omeprazole (PRILOSEC) 20 MG capsule Take 1 capsule (20 mg total) by mouth 2 (two) times daily before a meal.  . OVER THE COUNTER MEDICATION Apply 1 application topically as needed (for pain). OTC Pain Ointment     Allergies: Allergies as of 03/27/2017  . (No Known Allergies)   History reviewed. No pertinent past medical history.  Family History:  Patient reports her parents are healthy.  Social History:  Patient lives in [place], works as a Scientist, product/process development. She denies tobacco and alcohol use.  Review of Systems: A complete ROS was negative except as per HPI.    Physical Exam: Blood pressure 106/64, pulse (!) 116, temperature 99.2 F (37.3 C), temperature source Oral, resp. rate 16, SpO2 98 %. Physical Exam  Constitutional: She is oriented to person, place, and time. She appears well-developed and well-nourished. No distress.  HENT:  Head: Normocephalic and atraumatic.  Cardiovascular: Regular rhythm.   No murmur heard. Tachycardic  Pulmonary/Chest: Effort normal. No respiratory distress. She has no wheezes. She has no rales.  Abdominal: Soft. She exhibits no distension and no mass. There is no rebound and no guarding.  Right sided CVA tenderness and RLQ tenderness to palpation.  Musculoskeletal: She exhibits no edema or tenderness.  Neurological: She is alert and oriented to person, place, and time.  Skin: Skin is warm. She is not diaphoretic.  Psychiatric: She has a normal mood and affect.  EKG: N/A  CLINICAL DATA:  RIGHT lower quadrant pain, constipation, nausea.  EXAM: CT ABDOMEN AND PELVIS WITH CONTRAST  TECHNIQUE: Multidetector CT imaging of the abdomen and pelvis was performed using the standard protocol following bolus administration of intravenous contrast.  CONTRAST:  1 ISOVUE-300 IOPAMIDOL (ISOVUE-300) INJECTION 61%  COMPARISON:  None.  FINDINGS: LOWER CHEST: Lung bases are clear. Included heart  size is normal. No pericardial effusion.  HEPATOBILIARY: Mild focal fatty infiltration about the falciform ligament, liver is otherwise unremarkable.  PANCREAS: Normal.  SPLEEN: Normal.  ADRENALS/URINARY TRACT: Kidneys are orthotopic. Slightly striated RIGHT nephrogram with RIGHT urothelial enhancement. RIGHT perinephric and retroperitoneal fat stranding. No nephrolithiasis or renal masses. Well distended urinary bladder.  STOMACH/BOWEL: The stomach, small and large bowel are normal in course and caliber without inflammatory changes. Normal air-filled appendix.  VASCULAR/LYMPHATIC: Aortoiliac vessels are normal in course and caliber. No lymphadenopathy by CT size criteria.  REPRODUCTIVE: Normal.  OTHER: Trace free fluid in the pelvis is likely physiologic.  MUSCULOSKELETAL: Nonacute.  IMPRESSION: RIGHT urinary tract infection and early RIGHT pyelonephritis.  Normal appendix.   Electronically Signed   By: Elon Alas M.D.   On: 03/27/2017 15:58  Assessment & Plan by Problem:  [name] is a 31 y.o. Female with no significant PMHx who presented 03/27/2017 with back pain and abdominal pain admitted found to have UTI and imaging consistent with pyelonephritis.   Pyelonephritis - Patient presented with 1 day of right lower quadrant and right flank pain. She endorses urinary frequency and urine with malodorous smell. She reported symptoms of dysuria 2 weeks ago that resolved without intervention. She did not seek medical attention at that time. On evaluation today, patient has right sided CVA tenderness. UA showed large leukocytes, negative nitrates, and few bacteria. CT abdomen and pelvis concerning for right-sided pyelonephritis. S/p ceftriaxone 1 g in the ED. -- Continue IV Ceftriaxone 1g daily -- f/u urine culture -- Zofran prn  -- IVFs - NS @ 100 mL/hr -- Pain control - Tylenol prn   ?GERD: Patient reports she was recently started on Protonix  40 mg daily. She is scheduled for an endoscopy on Monday for further workup.  -- Continue Protonix 40 mg   FEN: NS 100 cc/hr, replete lytes prn, regular diet VTE ppx: Lovenox  Code Status: FULL   Dispo: Admit patient to Observation with expected length of stay less than 2 midnights.  Signed: Velna Ochs, MD 03/27/2017, 7:55 PM

## 2017-03-27 NOTE — ED Notes (Signed)
Pt given water per Whitney(RN)

## 2017-03-27 NOTE — ED Triage Notes (Signed)
Pt reports a headache and abdominal pain that is in lower rt side. Pt reports nausea as well.

## 2017-03-27 NOTE — ED Triage Notes (Signed)
Pt was sent by [facility] for R/O appendicitis

## 2017-03-27 NOTE — ED Provider Notes (Signed)
[place] DEPT Provider Note   CSN: [id number] Arrival date & time: 03/27/17  1219     History   Chief Complaint Chief Complaint  Patient presents with  . Abdominal Pain    HPI [facility] Lisa Lin is a 31 y.o. female.  HPI  Patient presents with concern of right-sided abdominal pain. Pain began last night, since onset has been severe. Patient is focally in the right lower abdomen, with occasional radiation across the inferior abdomen. There is associated nausea, but no vomiting, no diarrhea. No dysuria, hematuria. Patient was well prior to the onset of symptoms. She denies a history of abdominal surgery, does acknowledge a history of gastric discomfort, and is scheduled for endoscopy next week. Patient takes omeprazole regularly, has taken no other medications since onset of this illness.   History reviewed. No pertinent past medical history.  There are no active problems to display for this patient.   History reviewed. No pertinent surgical history.  OB History    No data available       Home Medications    Prior to Admission medications   Medication Sig Start Date End Date Taking? Authorizing Provider  medroxyPROGESTERone (DEPO-PROVERA) 150 MG/ML injection Inject 150 mg into the muscle every 3 (three) months.   Yes Historical Provider, MD  omeprazole (PRILOSEC) 20 MG capsule Take 1 capsule (20 mg total) by mouth 2 (two) times daily before a meal. 03/23/17  Yes Mandesia R Hairston, FNP  OVER THE COUNTER MEDICATION Apply 1 application topically as needed (for pain). OTC Pain Ointment   Yes Historical Provider, MD  ergocalciferol (VITAMIN D2) 50000 UNITS capsule Take 1 capsule (50,000 Units total) by mouth once a week. Patient not taking: Reported on 03/27/2017 12/25/14   Lance Bosch, NP    Family History No family history on file.  Social History Social History  Substance Use Topics  . Smoking status: Never Smoker  . Smokeless tobacco: Not on file  .  Alcohol use Not on file     Allergies   Patient has no known allergies.   Review of Systems Review of Systems  Constitutional:       Per HPI, otherwise negative  HENT:       Per HPI, otherwise negative  Respiratory:       Per HPI, otherwise negative  Cardiovascular:       Per HPI, otherwise negative  Gastrointestinal: Positive for abdominal pain and nausea. Negative for vomiting.  Endocrine:       Negative aside from HPI  Genitourinary:       Neg aside from HPI   Musculoskeletal:       Per HPI, otherwise negative  Skin: Negative.   Neurological: Negative for syncope.     Physical Exam Updated Vital Signs BP 129/86   Pulse (!) 119   Temp 99.2 F (37.3 C) (Oral)   Resp 16   SpO2 99%   Physical Exam  Constitutional: She is oriented to person, place, and time. She appears well-developed and well-nourished. No distress.  HENT:  Head: Normocephalic and atraumatic.  Eyes: Conjunctivae and EOM are normal.  Cardiovascular: Normal rate and regular rhythm.   Pulmonary/Chest: Effort normal and breath sounds normal. No stridor. No respiratory distress.  Abdominal: She exhibits no distension. There is tenderness in the right upper quadrant, right lower quadrant and periumbilical area.  Musculoskeletal: She exhibits no edema.  Neurological: She is alert and oriented to person, place, and time. No cranial nerve deficit.  Skin:  Skin is warm and dry.  Psychiatric: She has a normal mood and affect.  Nursing note and vitals reviewed.    ED Treatments / Results  Labs (all labs ordered are listed, but only abnormal results are displayed) Labs Reviewed  COMPREHENSIVE METABOLIC PANEL - Abnormal; Notable for the following:       Result Value   Total Bilirubin 1.3 (*)    All other components within normal limits  URINALYSIS, ROUTINE W REFLEX MICROSCOPIC - Abnormal; Notable for the following:    APPearance HAZY (*)    Hgb urine dipstick MODERATE (*)    Ketones, ur 5 (*)     Protein, ur 30 (*)    Leukocytes, UA LARGE (*)    Bacteria, UA FEW (*)    Squamous Epithelial / LPF 0-5 (*)    All other components within normal limits  DIFFERENTIAL - Abnormal; Notable for the following:    Neutro Abs 8.9 (*)    All other components within normal limits  LIPASE, BLOOD  CBC  I-STAT BETA HCG BLOOD, ED ([facility], [facility], [facility] ONLY)  I-STAT BETA HCG BLOOD, ED ([facility], [facility], [facility] ONLY)    Chart review notable for referral from urgent care earlier today with concern for appendicitis.  Radiology Ct Abdomen Pelvis W Contrast  Result Date: 03/27/2017 CLINICAL DATA:  RIGHT lower quadrant pain, constipation, nausea. EXAM: CT ABDOMEN AND PELVIS WITH CONTRAST TECHNIQUE: Multidetector CT imaging of the abdomen and pelvis was performed using the standard protocol following bolus administration of intravenous contrast. CONTRAST:  1 ISOVUE-300 IOPAMIDOL (ISOVUE-300) INJECTION 61% COMPARISON:  None. FINDINGS: LOWER CHEST: Lung bases are clear. Included heart size is normal. No pericardial effusion. HEPATOBILIARY: Mild focal fatty infiltration about the falciform ligament, liver is otherwise unremarkable. PANCREAS: Normal. SPLEEN: Normal. ADRENALS/URINARY TRACT: Kidneys are orthotopic. Slightly striated RIGHT nephrogram with RIGHT urothelial enhancement. RIGHT perinephric and retroperitoneal fat stranding. No nephrolithiasis or renal masses. Well distended urinary bladder. STOMACH/BOWEL: The stomach, small and large bowel are normal in course and caliber without inflammatory changes. Normal air-filled appendix. VASCULAR/LYMPHATIC: Aortoiliac vessels are normal in course and caliber. No lymphadenopathy by CT size criteria. REPRODUCTIVE: Normal. OTHER: Trace free fluid in the pelvis is likely physiologic. MUSCULOSKELETAL: Nonacute. IMPRESSION: RIGHT urinary tract infection and early RIGHT pyelonephritis. Normal appendix. Electronically Signed   By: Elon Alas M.D.   On: 03/27/2017 15:58     Procedures Procedures (including critical care time)  Medications Ordered in ED Medications  sodium chloride 0.9 % bolus 1,000 mL (0 mLs Intravenous Stopped 03/27/17 1450)    And  0.9 %  sodium chloride infusion ( Intravenous New Bag/Given 03/27/17 1400)  iopamidol (ISOVUE-300) 61 % injection (not administered)  cefTRIAXone (ROCEPHIN) 1 g in dextrose 5 % 50 mL IVPB (1 g Intravenous New Bag/Given 03/27/17 1616)  HYDROmorphone (DILAUDID) injection 0.5 mg (not administered)  fentaNYL (SUBLIMAZE) injection 25 mcg (25 mcg Intravenous Given 03/27/17 1312)  ondansetron (ZOFRAN) injection 4 mg (4 mg Intravenous Given 03/27/17 1312)  iopamidol (ISOVUE-300) 61 % injection (100 mLs  Contrast Given 03/27/17 1543)     Initial Impression / Assessment and Plan / ED Course  I have reviewed the triage vital signs and the nursing notes.  Pertinent labs & imaging results that were available during my care of the patient were reviewed by me and considered in my medical decision making (see chart for details).  On re-check the patient remains tachycardic, in pain.  IV ceftriaxone starting.  Given the persistency of the tachycardia  in spite of fluids, persistency of pain, concern for pyelonephritis, the patient will be admitted for further evaluation and management.  Final Clinical Impressions(s) / ED Diagnoses  Acute pyelonephritis, right.   Carmin Muskrat, MD 03/27/17 [phone]

## 2017-03-27 NOTE — Telephone Encounter (Signed)
Pt arrived to [facility]. C/o RLQ pain since yesterday and  nausea, headache. Pt walks in with bend in back guarding right side of abdomen. She states she has taken ASA for pain. Denies having fever or chills at home.   VS: T: 99.8, P: 128, R: 20, BP: 105/75 SpO2 99%  Pt is on Depo for birth control. No menses, denies blood in urine.   Notified pt provider of sx's.  Agreed to send patient to ED.

## 2017-03-28 DIAGNOSIS — B9689 Other specified bacterial agents as the cause of diseases classified elsewhere: Secondary | ICD-10-CM

## 2017-03-28 DIAGNOSIS — K219 Gastro-esophageal reflux disease without esophagitis: Secondary | ICD-10-CM

## 2017-03-28 DIAGNOSIS — E876 Hypokalemia: Secondary | ICD-10-CM

## 2017-03-28 DIAGNOSIS — N10 Acute tubulo-interstitial nephritis: Secondary | ICD-10-CM | POA: Diagnosis present

## 2017-03-28 LAB — COMPREHENSIVE METABOLIC PANEL
ALT: 13 U/L — ABNORMAL LOW (ref 14–54)
AST: 15 U/L (ref 15–41)
Albumin: 3.2 g/dL — ABNORMAL LOW (ref 3.5–5.0)
Alkaline Phosphatase: 53 U/L (ref 38–126)
Anion gap: 9 (ref 5–15)
BILIRUBIN TOTAL: 1.1 mg/dL (ref 0.3–1.2)
BUN: 5 mg/dL — ABNORMAL LOW (ref 6–20)
CO2: 22 mmol/L (ref 22–32)
Calcium: 8.4 mg/dL — ABNORMAL LOW (ref 8.9–10.3)
Chloride: 107 mmol/L (ref 101–111)
Creatinine, Ser: 0.57 mg/dL (ref 0.44–1.00)
Glucose, Bld: 93 mg/dL (ref 65–99)
POTASSIUM: 3.2 mmol/L — AB (ref 3.5–5.1)
Sodium: 138 mmol/L (ref 135–145)
TOTAL PROTEIN: 6.2 g/dL — AB (ref 6.5–8.1)

## 2017-03-28 LAB — CBC
HCT: 33.1 % — ABNORMAL LOW (ref 36.0–46.0)
Hemoglobin: 11 g/dL — ABNORMAL LOW (ref 12.0–15.0)
MCH: 30.1 pg (ref 26.0–34.0)
MCHC: 33.2 g/dL (ref 30.0–36.0)
MCV: 90.7 fL (ref 78.0–100.0)
Platelets: 157 10*3/uL (ref 150–400)
RBC: 3.65 MIL/uL — AB (ref 3.87–5.11)
RDW: 12.7 % (ref 11.5–15.5)
WBC: 8.7 10*3/uL (ref 4.0–10.5)

## 2017-03-28 LAB — GLUCOSE, CAPILLARY: GLUCOSE-CAPILLARY: 103 mg/dL — AB (ref 65–99)

## 2017-03-28 LAB — HIV ANTIBODY (ROUTINE TESTING W REFLEX): HIV SCREEN 4TH GENERATION: NONREACTIVE

## 2017-03-28 MED ORDER — POTASSIUM CHLORIDE CRYS ER 20 MEQ PO TBCR
40.0000 meq | EXTENDED_RELEASE_TABLET | Freq: Two times a day (BID) | ORAL | Status: AC
  Administered 2017-03-28 – 2017-03-29 (×2): 40 meq via ORAL
  Filled 2017-03-28 (×2): qty 2

## 2017-03-28 MED ORDER — GI COCKTAIL ~~LOC~~
30.0000 mL | Freq: Two times a day (BID) | ORAL | Status: DC | PRN
  Administered 2017-03-28: 30 mL via ORAL
  Filled 2017-03-28: qty 30

## 2017-03-28 MED ORDER — POTASSIUM CHLORIDE 20 MEQ/15ML (10%) PO SOLN
40.0000 meq | Freq: Once | ORAL | Status: AC
Start: 2017-03-28 — End: 2017-03-28
  Administered 2017-03-28: 40 meq via ORAL
  Filled 2017-03-28: qty 30

## 2017-03-28 MED ORDER — HYDROCODONE-ACETAMINOPHEN 5-325 MG PO TABS
1.0000 | ORAL_TABLET | ORAL | Status: DC | PRN
  Administered 2017-03-28 – 2017-03-30 (×4): 1 via ORAL
  Filled 2017-03-28: qty 2
  Filled 2017-03-28 (×3): qty 1

## 2017-03-28 NOTE — Progress Notes (Signed)
Informed MD of temp 103.77F. Administered Tylenol PRN.   Will continue to monitor   Paulla Fore, RN

## 2017-03-28 NOTE — Progress Notes (Signed)
   Subjective: Patient reports that she feels better this morning. She continues to have some right lower quadrant abdominal pain and nausea. She also endorses symptoms of heartburn which is chronic for her. Temperature this morning was 100.4.   Objective:  Vital signs in last 24 hours: Vitals:   03/27/17 1830 03/27/17 2037 03/28/17 0444 03/28/17 0959  BP: 106/64 97/69 105/67 96/62  Pulse: (!) 116 (!) 115 (!) 101 85  Resp: 16 17 18 18   Temp:  99.4 F (37.4 C) (!) 100.4 F (38 C) 97.9 F (36.6 C)  TempSrc:  Oral Oral Oral  SpO2: 98% 100% 100% 100%  Weight:  111 lb (50.3 kg)    Height:  5' (1.524 m)     Physical Exam Constitutional: NAD, appears comfortable Cardiovascular: Tachycardic but regular, no murmurs, rubs, or gallops.  Pulmonary/Chest: CTAB, no wheezes, rales, or rhonchi.  Abdominal: Tender to palpation in her right lower quadrant but soft and non distended, normal bowel sounds.  Extremities: Warm and well perfused.  No edema.  Neurological: A&Ox3, CN II - XII grossly intact.   Assessment/Plan:   Lisa Lin is a 31 y.o. Female with no significant PMHx who presented 03/27/2017 with back pain and abdominal pain admitted found to have UTI and imaging consistent with pyelonephritis.   Pyelonephritis - Patient presented with 1 day of right lower quadrant and right flank pain. She endorses urinary frequency and urine with malodorous smell. She reported symptoms of dysuria 2 weeks ago and did not seek medical attention at that time. UA on admission showed large leukocytes, negative nitrates, and few bacteria. CT abdomen and pelvis concerning for right-sided pyelonephritis. Today she reports her pain is somewhat improved. Continues to have nausea but is tolerating PO without N/V.  -- Continue IV Ceftriaxone for now; likely transition to PO antibiotics tomorrow if she remains afebrile and continues to improve  -- f/u urine culture -- Zofran prn  -- IVFs - NS @ 100  mL/hr -- Pain control - Tylenol prn   ?GERD: Patient reports she was recently started on Protonix 40 mg daily. She is scheduled for an endoscopy next Monday for further workup.  -- Continue Protonix 40 mg  -- GI cocktail BID prn   Hypokalemia: Potassium 3.2 this morning. Goal K > 4.0. -- 40 mEq BID x 2 doses   FEN: NS 100 cc/hr x 24 hours, replete lytes prn, regular diet VTE ppx: Lovenox  Code Status: FULL   Dispo: Anticipated discharge in approximately 1-2 day(s).   Velna Ochs, MD 03/28/2017, 10:12 AM Pager: [phone]

## 2017-03-28 NOTE — Progress Notes (Signed)
At 2006 03/27/17, [place] interpreter called for assistance in translating admission questions and help with patient assessment and education. Translator name is Cletus Gash and ID# is [phone]. Will continue to monitor patients status.

## 2017-03-29 LAB — CBC
HCT: 34.4 % — ABNORMAL LOW (ref 36.0–46.0)
Hemoglobin: 11.6 g/dL — ABNORMAL LOW (ref 12.0–15.0)
MCH: 30.7 pg (ref 26.0–34.0)
MCHC: 33.7 g/dL (ref 30.0–36.0)
MCV: 91 fL (ref 78.0–100.0)
PLATELETS: 168 10*3/uL (ref 150–400)
RBC: 3.78 MIL/uL — AB (ref 3.87–5.11)
RDW: 12.8 % (ref 11.5–15.5)
WBC: 6.9 10*3/uL (ref 4.0–10.5)

## 2017-03-29 LAB — BASIC METABOLIC PANEL
Anion gap: 6 (ref 5–15)
BUN: 5 mg/dL — AB (ref 6–20)
CO2: 21 mmol/L — ABNORMAL LOW (ref 22–32)
Calcium: 8.5 mg/dL — ABNORMAL LOW (ref 8.9–10.3)
Chloride: 107 mmol/L (ref 101–111)
Creatinine, Ser: 0.58 mg/dL (ref 0.44–1.00)
GFR calc Af Amer: >60 mL/min (ref >60)
GLUCOSE: 98 mg/dL (ref 65–99)
POTASSIUM: 3.6 mmol/L (ref 3.5–5.1)
Sodium: 134 mmol/L — ABNORMAL LOW (ref 135–145)

## 2017-03-29 LAB — GLUCOSE, CAPILLARY: Glucose-Capillary: 95 mg/dL (ref 65–99)

## 2017-03-29 MED ORDER — POTASSIUM CHLORIDE CRYS ER 20 MEQ PO TBCR
40.0000 meq | EXTENDED_RELEASE_TABLET | Freq: Once | ORAL | Status: AC
  Administered 2017-03-29: 40 meq via ORAL
  Filled 2017-03-29: qty 2

## 2017-03-29 NOTE — Progress Notes (Signed)
Paged Dr. Posey Pronto clarification KCL once and KCL @ 1000.   V/O Ok to give one time dose.

## 2017-03-29 NOTE — Progress Notes (Signed)
   Subjective: Patient continues to complain of pain in her right lower quadrant area. Febrile yesterday to 103.1 and 100.4 this morning.   Objective:  Vital signs in last 24 hours: Vitals:   03/28/17 1619 03/28/17 1819 03/28/17 2045 03/29/17 0433  BP:  (!) 98/59 101/70 109/61  Pulse:  99 94 (!) 105  Resp:  18 19 20   Temp: (!) 103.1 F (39.5 C) 99.6 F (37.6 C) 100 F (37.8 C) (!) 100.4 F (38 C)  TempSrc: Oral Oral Oral Oral  SpO2:  99% 98% 100%  Weight:   110 lb 10.7 oz (50.2 kg)   Height:       Physical Exam Constitutional: NAD, appears comfortable Cardiovascular: Tachycardic but regular, no murmurs, rubs, or gallops.  Pulmonary/Chest: CTAB, no wheezes, rales, or rhonchi.  Abdominal: Tender to palpation in her right lower quadrant but soft and non distended, normal bowel sounds.  Extremities: Warm and well perfused.  No edema.  Neurological: A&Ox3, CN II - XII grossly intact.   Assessment/Plan:  Lisa Lin is a 31 y.o. Female with no significant PMHx who presented 03/27/2017 with back pain and abdominal pain admitted found to have UTI and imaging consistent with pyelonephritis.  Pyelonephritis - Patient presented with 1 day of right lower quadrant and right flank pain. She endorses urinary frequency and urine with malodorous smell. She reportedsymptoms of dysuria 2 weeks ago but did not seek medical attention at that time. UA on admission showed large leukocytes, negative nitrates, and few bacteria. CT abdomen and pelvis concerning for right-sided pyelonephritis. Today she continues to have pain. She is persistently febrile, temp of 103.1 yesterday and 100.4 today. She is tolerating PO without N/V. Urine cultures and blood cultures are pending.  -- Continue IV Ceftriaxone (day 3) -- f/u 4/23 urine culture -- f/u 4/24 blood cultures  -- Norco prn  -- Zofran prn  -- Tylenol prn   ?GERD:Patient reports she was recently started on Protonix 40 mg daily. She is  scheduled for an endoscopy next Monday for further workup.  -- Continue Protonix 40 mg  -- GI cocktail BID prn   Hypokalemia: s/p 40 mEq x 2 yesterday. Potassium increased 3.2 -> 3.6. Goal > 4.0.  -- 40 mEq x 1 today   FEN: no fluids, replete lytes prn, regular diet VTE ppx: Lovenox  Code Status: FULL   Dispo: Anticipated discharge in approximately 1-2 day(s).   Velna Ochs, MD 03/29/2017, 8:07 AM Pager: [phone]

## 2017-03-30 ENCOUNTER — Telehealth: Payer: Self-pay

## 2017-03-30 DIAGNOSIS — B9620 Unspecified Escherichia coli [E. coli] as the cause of diseases classified elsewhere: Secondary | ICD-10-CM

## 2017-03-30 LAB — BASIC METABOLIC PANEL
ANION GAP: 8 (ref 5–15)
BUN: 8 mg/dL (ref 6–20)
CHLORIDE: 106 mmol/L (ref 101–111)
CO2: 22 mmol/L (ref 22–32)
Calcium: 8.9 mg/dL (ref 8.9–10.3)
Creatinine, Ser: 0.53 mg/dL (ref 0.44–1.00)
GFR calc non Af Amer: >60 mL/min (ref >60)
GLUCOSE: 89 mg/dL (ref 65–99)
Potassium: 3.9 mmol/L (ref 3.5–5.1)
Sodium: 136 mmol/L (ref 135–145)

## 2017-03-30 LAB — CBC
HCT: 34.5 % — ABNORMAL LOW (ref 36.0–46.0)
Hemoglobin: 11.5 g/dL — ABNORMAL LOW (ref 12.0–15.0)
MCH: 30.4 pg (ref 26.0–34.0)
MCHC: 33.3 g/dL (ref 30.0–36.0)
MCV: 91.3 fL (ref 78.0–100.0)
Platelets: 189 10*3/uL (ref 150–400)
RBC: 3.78 MIL/uL — AB (ref 3.87–5.11)
RDW: 12.9 % (ref 11.5–15.5)
WBC: 5.7 10*3/uL (ref 4.0–10.5)

## 2017-03-30 LAB — URINE CULTURE: Culture: >=100000 — AB

## 2017-03-30 LAB — GLUCOSE, CAPILLARY: Glucose-Capillary: 89 mg/dL (ref 65–99)

## 2017-03-30 MED ORDER — CEFDINIR 300 MG PO CAPS: 300.0000 mg | ORAL_CAPSULE | Freq: Two times a day (BID) | ORAL | 0 refills | Status: DC

## 2017-03-30 MED ORDER — HYDROCODONE-ACETAMINOPHEN 5-325 MG PO TABS: 1.0000 | ORAL_TABLET | Freq: Four times a day (QID) | ORAL | 0 refills | Status: DC | PRN

## 2017-03-30 NOTE — Discharge Summary (Signed)
Name: Benetta Maclaren MRN: [id number] DOB: Mar 01, 1986 31 y.o. PCP: Alfonse Spruce, FNP  Date of Admission: 03/27/2017 12:37 PM Date of Discharge: 03/30/2017 Attending Physician: Aldine Contes, MD  Discharge Diagnosis: 1. Pyelonephritis   Discharge Medications: Allergies as of 03/30/2017   No Known Allergies     Medication List    TAKE these medications   cefdinir 300 MG capsule Commonly known as:  OMNICEF Take 1 capsule (300 mg total) by mouth 2 (two) times daily.   ergocalciferol 50000 units capsule Commonly known as:  VITAMIN D2 Take 1 capsule (50,000 Units total) by mouth once a week.   HYDROcodone-acetaminophen 5-325 MG tablet Commonly known as:  NORCO/VICODIN Take 1 tablet by mouth every 6 (six) hours as needed for moderate pain or severe pain.   medroxyPROGESTERone 150 MG/ML injection Commonly known as:  DEPO-PROVERA Inject 150 mg into the muscle every 3 (three) months.   omeprazole 20 MG capsule Commonly known as:  PRILOSEC Take 1 capsule (20 mg total) by mouth 2 (two) times daily before a meal.   OVER THE COUNTER MEDICATION Apply 1 application topically as needed (for pain). OTC Pain Ointment       Disposition and follow-up:   Ms.Kaedance Lanny Cramp was discharged from [facility] in Stable condition.  At the hospital follow up visit please address:  1.  Right Pyelonephritis: Patient received IV ceftriaxone x 3 days and was discharged on Cefdinir BID to complete a 14 day course. Urine cultures grew > 100,000 colonies of E coli resistant to ampicillin, sensitive to cephalosporins. Blood cultures negative x 4 days, final read pending on discharge.   2.  Labs / imaging needed at time of follow-up: None  3.  Pending labs/ test needing follow-up: Final Blood Culture report   Follow-up Appointments: Follow-up Information    Velna Ochs, MD Follow up on 04/06/2017.   Specialty:  Internal Medicine Why:  3:15 pm. Our clinic is  located in the basement of the hospital in the [facility]. Contact information: [place] [id number] [address] [facility] Course by problem list:  1. Right Sided Pyelonephritis: Dayani Winbush is a 31 y.o. Spanish speaking female with no significant PMHx who presented 03/27/2017 with back pain and abdominal pain. History was obtained with the help of a translator. Patient reported symptoms of dysuria about 2 weeks prior to admission for which she did not seek medical attention. She was feeling fine until the day before presentation when she began to have right sided lower abdominal pain with associated urinary frequency and malodorous urine. Pain radiated across her lower abdomen. She had nausea without emesis or diarrhea. On arrival to the ED, patient was afebrile but tachycardic to 130s and hemodynamically stable. UA showed large leukocytes, negative nitrites, few bacteria, and moderate hematuria. CT abdomen and pelvis showed changes consistent with early right sided pyelonephritis. She was admitted and placed on IV ceftriaxone and IVFs. The following day, patient became febrile to 103.1 and blood cultures were collected. IV ceftriaxone was continued x 3 days. Urine cultures ultimately grew > 100,000 colonies of E coli resistant to ampicillin, sensitive to cephalosporins. Patient improved clinically and she was transitioned to cefdinir BID to complete a 14 day course on discharge. Blood cultures remained negative, final read pending on discharge.   Discharge Vitals:   BP 101/62 (BP Location: Left Arm)   Pulse 86   Temp 98.2  F (36.8 C) (Oral)   Resp 20   Ht 5' (1.524 m)   Wt 110 lb 14.3 oz (50.3 kg)   SpO2 100%   BMI 21.66 kg/m   Pertinent Labs, Studies, and Procedures:   03/27/2017 CT Abdomen/Pelvis W Contrast:  IMPRESSION: RIGHT urinary tract infection and early RIGHT pyelonephritis.  Normal appendix.  Discharge Instructions: Discharge Instructions      Diet - low sodium heart healthy    Complete by:  As directed    Discharge instructions    Complete by:  As directed    Ms. Macario Carls,  It was a pleasure being your doctor. I am glad you are feeling better. You were treated in the hospital for pyelonephritis. Please pick up your antibiotic prescription for Cefdinir from the [employer] at [employer] [facility] [employer]. Take the antibiotic twice a day for 10 more days. Please follow up in our clinic on May 3 as listed in this packet. Our clinic is located in the basement of the hospital on the east wing. If you have any questions or concerns, call our clinic at [phone] or after hours call [phone] and ask for the internal medicine resident on call. Thank you!  - Dr. Haze Justin. [name] un placer ser su doctor. Me alegra que te sientas mejor. Te trataron en el hospital por pielonefritis. Por favor, recoja su receta de antibiticos para Cefdinir en la Film/video editor en [place] y [employer]. [place] 10 das ms. Por favor haga un seguimiento en nuestra clnica el 3 de mayo como se detalla en este paquete. Cleotis Nipper clnica est ubicada en el stano del hospital en el ala este. Si tiene alguna pregunta o inquietud, llame a Haskell Riling al [phone] o despus del horario de atencin llame al [phone] y pregunte por el residente de medicina interna de turno. Gracias!  - Dr. Philipp Ovens   Increase activity slowly    Complete by:  As directed       Signed: Velna Ochs, MD 03/30/2017, 2:51 PM   Pager: [phone]

## 2017-03-30 NOTE — Progress Notes (Signed)
   Subjective: Patient is doing well this morning. Pain has improved. She is tolerating PO without N/V. Afebrile since yesterday morning.   Objective:  Vital signs in last 24 hours: Vitals:   03/29/17 1735 03/29/17 2134 03/30/17 0516 03/30/17 0942  BP: 106/62 97/64 (!) 97/58 101/62  Pulse: 98 68 88 86  Resp: 20 18 19 20   Temp: 98.6 F (37 C) 98 F (36.7 C) 98.5 F (36.9 C) 98.2 F (36.8 C)  TempSrc: Oral Oral Oral Oral  SpO2: 99% 100% 100% 100%  Weight:  110 lb 14.3 oz (50.3 kg)    Height:       Physical Exam Constitutional: NAD, appears comfortable Cardiovascular:RRR, no murmurs, rubs, or gallops.  Pulmonary/Chest:CTAB, no wheezes, rales, or rhonchi.  Abdominal: Mildly tender to palpation in her right lower quadrant but soft and non distended, normal bowel sounds.  Extremities: Warm and well perfused. No edema.  Neurological:A&Ox3, CN II - XII grossly intact.   Assessment/Plan:  Lisa Lin is a 31 y.o. Female with no significant PMHx who presented 03/27/2017 with back pain and abdominal pain admitted found to have UTI and imaging consistent with pyelonephritis.  Pyelonephritis - Patient presented with 1 day of right lower quadrant and right flank pain. She endorsed urinary frequency and urine with malodorous smell. She reportedsymptoms of dysuria 2 weeks ago butdid not seek medical attention at that time. UA on admissionshowed large leukocytes, negative nitrates, and few bacteria. CT abdomen and pelvis concerning for right-sided pyelonephritis. Urine cultures grew E coli resistant to ampicillin. She has improved clinically with IV Ceftriaxone (today is day 4).  -- Transition to PO Cefdinir to complete a 14 day course  -- f/u 4/24 blood cultures >> No growth x 24 hours -- Norco prn  -- Zofran prn  -- Tylenol prn  -- Discharge with plans to follow up with [facility]   ?GERD:Patient reports she was recently started on Protonix 40 mg daily. She is scheduled for an  endoscopy nextMonday for further workup.  -- Continue Protonix 40 mg  -- GI cocktail BID prn   Hypokalemia: resolved   FEN: no fluids, replete lytes prn, regular diet VTE ppx: Lovenox  Code Status: FULL   Dispo: Anticipated discharge today.   Velna Ochs, MD 03/30/2017, 12:55 PM Pager: [phone]

## 2017-03-30 NOTE — Telephone Encounter (Signed)
Hospital TOC per Dr Philipp Ovens, discharge 03/30/2017, appt 04/06/2017.

## 2017-03-30 NOTE — Discharge Instructions (Signed)
°  Pielonefritis en los adultos (Pyelonephritis, Adult) La pielonefritis es una infeccin del rin. Los riones son los rganos que ayudan a limpiar la sangre al [employer] residuos a travs de la [place]. Esta infeccin puede curarse rpidamente o durar [employer]. En la [employer], desaparece con el tratamiento y no causa otros problemas. [place] los medicamentos de venta libre y los recetados solamente como se lo haya indicado el [place] antibitico como se lo indic su mdico. No deje de tomar los medicamentos aunque comience a Sports administrator. Instrucciones generales  Beba suficiente lquido para mantener el pis claro o de color amarillo plido.  Evite la cafena, el t y las bebidas gaseosas.  Orine con frecuencia. Evite retener la orina durante largos perodos.  Orine antes y despus [place].  Despus de defecar, las mujeres deben higienizarse desde adelante hacia atrs. Use cada trozo de papel higinico solo una vez.  Concurra a todas las visitas de control como se lo haya indicado el mdico. Esto es importante. SOLICITE AYUDA SI:  No mejora luego de 2 das.  Los sntomas empeoran.  Tiene fiebre. SOLICITE AYUDA DE INMEDIATO SI:  No puede tomar los medicamentos o beber lquidos segn las indicaciones.  Siente [place] a Emergency planning/management officer.  Vomita.  Tiene un dolor muy intenso en el costado (fosa lumbar) o en la espalda.  Se siente muy dbil o se desvanece (se desmaya). Esta informacin no tiene Marine scientist el consejo del mdico. Asegrese de hacerle al mdico cualquier pregunta que tenga. Document Released: 11/21/2005 Document Revised: 08/12/2015 Document Reviewed: 03/16/2015 Elsevier Interactive Patient Education  2017 [employer].

## 2017-03-30 NOTE — Progress Notes (Signed)
[name] to be D/C'd Home per MD order.  Discussed prescriptions and follow up appointments with the patient. Prescriptions given to patient, medication list explained in detail. Pt verbalized understanding.  Allergies as of 03/30/2017   No Known Allergies     Medication List    TAKE these medications   cefdinir 300 MG capsule Commonly known as:  OMNICEF Take 1 capsule (300 mg total) by mouth 2 (two) times daily.   ergocalciferol 50000 units capsule Commonly known as:  VITAMIN D2 Take 1 capsule (50,000 Units total) by mouth once a week.   HYDROcodone-acetaminophen 5-325 MG tablet Commonly known as:  NORCO/VICODIN Take 1 tablet by mouth every 6 (six) hours as needed for moderate pain or severe pain.   medroxyPROGESTERone 150 MG/ML injection Commonly known as:  DEPO-PROVERA Inject 150 mg into the muscle every 3 (three) months.   omeprazole 20 MG capsule Commonly known as:  PRILOSEC Take 1 capsule (20 mg total) by mouth 2 (two) times daily before a meal.   OVER THE COUNTER MEDICATION Apply 1 application topically as needed (for pain). OTC Pain Ointment       Vitals:   03/30/17 0516 03/30/17 0942  BP: (!) 97/58 101/62  Pulse: 88 86  Resp: 19 20  Temp: 98.5 F (36.9 C) 98.2 F (36.8 C)    Skin clean, dry and intact without evidence of skin break down, no evidence of skin tears noted. IV catheter discontinued intact. Site without signs and symptoms of complications. Dressing and pressure applied. Pt denies pain at this time. No complaints noted.  An After Visit Summary was printed and given to the patient. Patient escorted via ambulation, and D/C home via private auto.  Dixie Dials RN, BSN

## 2017-04-02 LAB — CULTURE, BLOOD (ROUTINE X 2)
CULTURE: NO GROWTH
Culture: NO GROWTH
SPECIAL REQUESTS: ADEQUATE
SPECIAL REQUESTS: ADEQUATE

## 2017-04-03 ENCOUNTER — Ambulatory Visit (INDEPENDENT_AMBULATORY_CARE_PROVIDER_SITE_OTHER): Payer: Self-pay | Admitting: Nurse Practitioner

## 2017-04-03 ENCOUNTER — Encounter: Payer: Self-pay | Admitting: Nurse Practitioner

## 2017-04-03 VITALS — BP 106/60 | HR 70 | Ht 60.0 in | Wt 109.0 lb

## 2017-04-03 DIAGNOSIS — K5900 Constipation, unspecified: Secondary | ICD-10-CM

## 2017-04-03 DIAGNOSIS — R1010 Upper abdominal pain, unspecified: Secondary | ICD-10-CM

## 2017-04-03 DIAGNOSIS — R110 Nausea: Secondary | ICD-10-CM

## 2017-04-03 DIAGNOSIS — R1032 Left lower quadrant pain: Secondary | ICD-10-CM

## 2017-04-03 NOTE — Progress Notes (Signed)
HPI: Patient is 31 year old female referred by PCP Fredia Beets FNP for abdominal pain and nausea.  She is a accompanied by [place] interpreter Almyra Free. Patient  gives a 2 year history of upper abdominal pain, mainly postprandial and associated with nausea. Pain does tend to radiate through to her back.. She describes the pain as a burning sensation. No NSAID use. No associated weight loss. Recent H. pylori negative. Recent LFTs are unremarkable. Hgb ~ 11, at baseline for her. Recent CT scan done during hospital admission for pyelonephritis was unremarkable for bowel abnormalities. There is mild fatty liver disease.  Started Prilosec a couple months ago and symptoms 75% better.  Patient also giveis a history of chronic constipation defined as infrequent bowel movements and hard stools. She drinks plenty of fluids and gets plenty of fiber. She has left lower quadrant discomfort described as spasm-like pain prior to defecation. No dysuria. She is on birth control.   Past Medical History:  Diagnosis Date  . Colitis   . Gastritis   . GERD (gastroesophageal reflux disease)   . Pyelonephritis     No past surgical history on file. No family history on file. Social History  Substance Use Topics  . Smoking status: Never Smoker  . Smokeless tobacco: Never Used  . Alcohol use No   Current Outpatient Prescriptions  Medication Sig Dispense Refill  . cefdinir (OMNICEF) 300 MG capsule Take 1 capsule (300 mg total) by mouth 2 (two) times daily. 20 capsule 0  . ergocalciferol (VITAMIN D2) 50000 UNITS capsule Take 1 capsule (50,000 Units total) by mouth once a week. (Patient not taking: Reported on 03/27/2017) 12 capsule 0  . HYDROcodone-acetaminophen (NORCO/VICODIN) 5-325 MG tablet Take 1 tablet by mouth every 6 (six) hours as needed for moderate pain or severe pain. 10 tablet 0  . medroxyPROGESTERone (DEPO-PROVERA) 150 MG/ML injection Inject 150 mg into the muscle every 3 (three) months.    Marland Kitchen  omeprazole (PRILOSEC) 20 MG capsule Take 1 capsule (20 mg total) by mouth 2 (two) times daily before a meal. 120 capsule 0  . OVER THE COUNTER MEDICATION Apply 1 application topically as needed (for pain). OTC Pain Ointment     No current facility-administered medications for this visit.    No Known Allergies   Review of Systems: Positive for back pain, vision changes, cough, headaches, swelling of feet and legs, excessive thirst and excessive urination. All other systems reviewed and negative except where noted in HPI.    Physical Exam: There were no vitals taken for this visit. Constitutional:  Thin Hispanic female in no acute distress. Psychiatric: Normal mood and affect. Behavior is normal. EENT: Conjunctivae are normal. No scleral icterus. Neck supple.  Cardiovascular: Normal rate, regular rhythm.  Pulmonary/chest: Effort normal and breath sounds normal. No wheezing, rales or rhonchi. Abdominal: Soft, nondistended, mild left upper quadrant and left  lower quadrant tenderness. Bowel sounds active throughout. There are no masses palpable. No hepatomegaly. Extremities: no edema Lymphadenopathy: No cervical adenopathy noted. Neurological: Alert and oriented to person place and time. Skin: Skin is warm and dry. No rashes noted.   ASSESSMENT AND PLAN:  1. 31 yo Spanish speaking female with two year history of postprandial upper abdominal burning / nausea. Symptoms 75% better after starting Prilosec a couple of months ago. Recent H. pylori negative. Rule out gastritis/peptic ulcer disease / functional dyspepsia. Gallbladder disease seems unlikely. She is a little tender in the left upper quadrant on exam but labs and  weight are reassuring.  -Continue twice a day Prilosec for now. Follow up with me in one month. If symptoms continue to improve will decrease Prilosec to once daily. Hopefully we can eventually transition her to an H2 blocker, or get her off meds all together. If not  improving then will need to consider doing an upper endoscopy.   2. Mild hypoalbuminemia, 3.2. Possibly acute phase reaction in setting of pyelonephritis. Previous levels normal.   3. Chronic constipation and LLQ discomfort (spasm like pain) prior to and relieved with defecation. Describes constipation as infrequent bowel movements, hard stool. She drinks plenty of fluids and incorporates fiber to diet -Trial of MiraLAX 3 times a week. Can titrate up to once daily as needed for effect -Suspect the left lower quadrant pain is secondary to constipation. She will follow-up with me in one month, if constipation resolves but pain persistent then will proceed with further workup. She gets regular gynecologic exams. No dysuria. Recent pregnancy test negative   Tye Savoy, NP  04/03/2017, 10:53 AM  Cc: Alfonse Spruce, F*

## 2017-04-03 NOTE — Patient Instructions (Addendum)
If you are age 29 or older, your body mass index should be between 23-30. Your Body mass index is 21.29 kg/m. If this is out of the aforementioned range listed, please consider follow up with your Primary Care Provider.  If you are age 33 or younger, your body mass index should be between 19-25. Your Body mass index is 21.29 kg/m. If this is out of the aformentioned range listed, please consider follow up with your Primary Care Provider.   Continue Prilosec twice daily until seen again.  Purchase Miralax (over-the-counter) use 3 times weekly may titrate up or down to once daily for effect.  Follow up appointment for 05/03/17 at 1030 am.  Thank you for choosing me and [facility].  Tye Savoy, NP

## 2017-04-04 NOTE — Progress Notes (Signed)
Reviewed and agree with initial management plan.  Stephen Baruch T. [place] Capers, MD FACG 

## 2017-04-06 ENCOUNTER — Ambulatory Visit (INDEPENDENT_AMBULATORY_CARE_PROVIDER_SITE_OTHER): Payer: Self-pay | Admitting: Internal Medicine

## 2017-04-06 ENCOUNTER — Encounter: Payer: Self-pay | Admitting: Internal Medicine

## 2017-04-06 VITALS — BP 113/72 | HR 80 | Temp 98.3°F | Wt 110.8 lb

## 2017-04-06 DIAGNOSIS — Z5189 Encounter for other specified aftercare: Secondary | ICD-10-CM

## 2017-04-06 DIAGNOSIS — N10 Acute tubulo-interstitial nephritis: Secondary | ICD-10-CM

## 2017-04-06 DIAGNOSIS — K219 Gastro-esophageal reflux disease without esophagitis: Secondary | ICD-10-CM | POA: Insufficient documentation

## 2017-04-06 DIAGNOSIS — B9620 Unspecified Escherichia coli [E. coli] as the cause of diseases classified elsewhere: Secondary | ICD-10-CM

## 2017-04-06 DIAGNOSIS — N12 Tubulo-interstitial nephritis, not specified as acute or chronic: Secondary | ICD-10-CM

## 2017-04-06 LAB — POCT URINALYSIS DIPSTICK
BILIRUBIN UA: NEGATIVE
GLUCOSE UA: NEGATIVE
Ketones, UA: NEGATIVE
Leukocytes, UA: NEGATIVE
Nitrite, UA: NEGATIVE
Protein, UA: NEGATIVE
SPEC GRAV UA: 1.025 (ref 1.010–1.025)
Urobilinogen, UA: 0.2 E.U./dL
pH, UA: 5.5 (ref 5.0–8.0)

## 2017-04-06 NOTE — Progress Notes (Signed)
   CC: Hospital follow up   HPI:  [name] is a 31 y.o. female with past medical history outlined below here for hospital follow up. For the details of today's visit, please refer to the assessment and plan.  Past Medical History:  Diagnosis Date  . Colitis   . Gastritis   . GERD (gastroesophageal reflux disease)   . Pyelonephritis   . Pyelonephritis     Review of Systems:  All pertinents listed in HPI, otherwise negative  Physical Exam:  Vitals:   04/06/17 1552  BP: 113/72  Pulse: 80  Temp: 98.3 F (36.8 C)  TempSrc: Oral  SpO2: 100%  Weight: 110 lb 12.8 oz (50.3 kg)    Constitutional: NAD, appears comfortable Cardiovascular: RRR, no murmurs, rubs, or gallops.  Pulmonary/Chest: CTAB, no wheezes, rales, or rhonchi.  Abdominal: Soft, non tender, non distended. +BS. Negative CVA tenderness Extremities: Warm and well perfused. No edema.  Neurological: A&Ox3, CN II - XII grossly intact.  Skin: No rashes or erythema  Psychiatric: Normal mood and affect  Assessment & Plan:   See Encounters Tab for problem based charting.  Patient discussed with Dr. Dareen Piano

## 2017-04-06 NOTE — Patient Instructions (Signed)
Lisa Lin,  It was a pleasure to see you today! I am glad you are feeling better. Please continue to take your antibiotic for the next two days to complete your treatment. You may return for follow up in 6 months. If you have any problems, please come back sooner. If you have any questions or concerns, call our clinic at [phone] or after hours call [phone] and ask for the internal medicine resident on call. Thank you!  - Lisa Lin. [name] un placer verte hoy! Me alegra que te sientas mejor. Contine tomando su antibitico durante los [place] para completar su tratamiento. Puede regresar para un seguimiento en 6 meses. Si tiene algn problema, regrese antes. Si tiene alguna pregunta o inquietud, llame a Lisa Lin al [phone] o despus del horario de atencin llame al [phone] y pregunte por el residente de medicina interna de turno. Gracias! - Lisa Lin   Pielonefritis en los adultos (Pyelonephritis, Adult) La pielonefritis es una infeccin del rin. Los riones son los rganos que filtran la sangre y [employer] residuos del torrente sanguneo a travs de la orina. La orina pasa desde los riones, a travs de los urteres, Water engineer la vejiga. Hay dos tipos principales de pielonefritis:  Infecciones que se inician rpidamente sin sntomas previos (pielonefritis aguda).  Infecciones que persisten durante un perodo prolongado (pielonefritis crnica). En la [employer], la infeccin desaparece con el tratamiento y no causa otros problemas. Las infecciones ms graves o crnicas a veces pueden propagarse al torrente sanguneo u ocasionar otros problemas en los riones. CAUSAS Por lo general, entre las causas de esta afeccin, se incluyen las siguientes:  Bacterias que pasan desde la vejiga al rin a travs de la orina infectada. La orina de la vejiga puede infectarse por bacterias relacionadas con estas causas:  Infeccin en la vejiga  (cistitis).  Inflamacin de la prstata (prostatitis).  Relaciones sexuales en las mujeres.  Bacterias que pasan del torrente sanguneo al rin. FACTORES DE RIESGO Es ms probable que esta afeccin se manifieste en:  Las embarazadas.  Las personas de edad avanzada.  Los diabticos.  Las personas que tienen clculos en los riones o la vejiga.  Las personas que tienen otras anomalas en el rin o los urteres.  Las personas que tienen una sonda vesical.  Las personas con Hotel manager.  Las personas que son sexualmente activas.  Las mujeres que usan espermicidas.  Las personas que han tenido una infeccin previa en las vas [place]. SNTOMAS Los sntomas de esta afeccin incluyen lo siguiente:  Ganas frecuentes de Garment/textile technologist.  Necesidad intensa o persistente de Garment/textile technologist.  Sensacin de ardor o escozor al [employer].  Dolor abdominal.  Dolor de espalda.  Dolor al costado del cuerpo o en la fosa lumbar.  Lisa Lin.  Escalofros.  Sangre en la [place] u [place].  Nuseas.  Vmitos. DIAGNSTICO Esta afeccin se puede diagnosticar en funcin de lo siguiente:  Examen fsico e historia clnica.  Anlisis de [place].  Anlisis de [place]. Tambin pueden [employer] de diagnstico por imgenes de los riones, por [place], una ecografa o una tomografa computarizada. TRATAMIENTO El tratamiento de esta afeccin puede depender de la gravedad de la infeccin.  Si la infeccin es leve y se detecta rpidamente, pueden administrarle antibiticos por va oral. Deber tomar lquido para permanecer hidratado.  Si la infeccin es ms grave, es posible que deban hospitalizarlo para administrarle antibiticos directamente en una vena a travs de una va intravenosa (  IV). Quizs tambin deban administrarle lquidos a travs de una va intravenosa si no se encuentra bien hidratado. Despus de la hospitalizacin, es posible que deba tomar antibiticos durante un [place]. Podrn  prescribirle otros tratamientos segn la causa de la infeccin. [place] los medicamentos de venta libre y los recetados solamente como se lo haya indicado el mdico.  Si le recetaron un antibitico, tmelo como se lo haya indicado el mdico. No deje de tomar los antibiticos aunque comience a Sports administrator. Instrucciones generales  Beba suficiente lquido para mantener la orina clara o de color amarillo plido.  Evite la cafena, el t y las bebidas gaseosas. Estas sustancias irritan la vejiga.  Orine con frecuencia. Evite retener la orina durante largos perodos.  Orine antes y despus [place].  Despus de defecar, las mujeres deben higienizarse la regin perineal desde adelante hacia atrs. Use cada trozo de papel higinico solo una vez.  Concurra a todas las visitas de control como se lo haya indicado el mdico. Esto es importante. SOLICITE ATENCIN MDICA SI:  Los sntomas no mejoran despus de 2das de tratamiento.  Los sntomas empeoran.  Tiene fiebre. SOLICITE ATENCIN MDICA DE INMEDIATO SI:  No puede tomar los antibiticos ni ingerir lquidos.  Comienza a sentir escalofros.  Vomita.  Siente un dolor intenso en la espalda o en la fosa lumbar.  Se desmaya o siente una debilidad extrema. Esta informacin no tiene Marine scientist el consejo del mdico. Asegrese de hacerle al mdico cualquier pregunta que tenga. Document Released: 08/31/2005 Document Revised: 08/12/2015 Document Reviewed: 03/16/2015 Elsevier Interactive Patient Education  2017 [employer].

## 2017-04-06 NOTE — Assessment & Plan Note (Signed)
Patient is here for hospital follow-up. She was discharged from the hospital on 03/30/2017 after hospitalization for right-sided pyelonephritis. She received 3 days of IV ceftriaxone and was discharged with a prescription for cefdinir to complete a two-week course. Urine cultures grew > 100,000 colonies of E coli resistant to ampicillin, sensitive to cephalosporins. Blood cultures were negative. She is afebrile today and she reports her pain has resolved. She continues to have some mild residual dysuria. Urinalysis today is negative for leukocytes and nitrites. Positive only for trace hematuria. I reassured patient that symptoms are likely related to her resolving infection. Instructed her to complete her antibiotics. She has two days left in her course.  -- Finish cefdinir 300 mg BID -- Follow up as needed

## 2017-04-12 NOTE — Progress Notes (Signed)
Internal Medicine Clinic Attending  Case discussed with Dr. Guilloud at the time of the visit.  We reviewed the resident's history and exam and pertinent patient test results.  I agree with the assessment, diagnosis, and plan of care documented in the resident's note.  

## 2017-04-21 NOTE — Telephone Encounter (Signed)
No answer multiple times

## 2017-05-02 ENCOUNTER — Ambulatory Visit: Payer: Self-pay | Attending: Internal Medicine | Admitting: *Deleted

## 2017-05-02 DIAGNOSIS — Z3042 Encounter for surveillance of injectable contraceptive: Secondary | ICD-10-CM | POA: Insufficient documentation

## 2017-05-02 MED ORDER — MEDROXYPROGESTERONE ACETATE 150 MG/ML IM SUSP
150.0000 mg | Freq: Once | INTRAMUSCULAR | Status: AC
  Administered 2017-05-02: 150 mg via INTRAMUSCULAR

## 2017-05-02 NOTE — Progress Notes (Signed)
   Date last pap: 12/12/2014 Last Depo-Provera: 02/01/2017. Side Effects if any: denies Serum HCG indicated? n/a Depo-Provera 150 mg IM given by T. Marland Kitchen, RN Next appointment due 07/18/2017 through 08/02/2017

## 2017-05-03 ENCOUNTER — Ambulatory Visit: Payer: Self-pay | Admitting: Nurse Practitioner

## 2017-05-23 ENCOUNTER — Ambulatory Visit: Payer: Self-pay | Attending: Family Medicine

## 2017-05-25 ENCOUNTER — Encounter (INDEPENDENT_AMBULATORY_CARE_PROVIDER_SITE_OTHER): Payer: Self-pay

## 2017-05-25 ENCOUNTER — Ambulatory Visit (INDEPENDENT_AMBULATORY_CARE_PROVIDER_SITE_OTHER): Payer: Self-pay | Admitting: Nurse Practitioner

## 2017-05-25 ENCOUNTER — Encounter: Payer: Self-pay | Admitting: Nurse Practitioner

## 2017-05-25 VITALS — BP 100/62 | HR 84 | Ht 60.0 in | Wt 108.5 lb

## 2017-05-25 DIAGNOSIS — R1013 Epigastric pain: Secondary | ICD-10-CM

## 2017-05-25 DIAGNOSIS — K219 Gastro-esophageal reflux disease without esophagitis: Secondary | ICD-10-CM

## 2017-05-25 NOTE — Patient Instructions (Addendum)
If you are age 29 or older, your body mass index should be between 23-30. Your Body mass index is 21.19 kg/m. If this is out of the aforementioned range listed, please consider follow up with your Primary Care Provider.  If you are age 12 or younger, your body mass index should be between 19-25. Your Body mass index is 21.19 kg/m. If this is out of the aformentioned range listed, please consider follow up with your Primary Care Provider.   You have been scheduled for an endoscopy. Please follow written instructions given to you at your visit today. If you use inhalers (even only as needed), please bring them with you on the day of your procedure. Your physician has requested that you go to www.startemmi.com and enter the access code given to you at your visit today. This web site gives a general overview about your procedure. However, you should still follow specific instructions given to you by our office regarding your preparation for the procedure.  You have been given GERD Literature.  Enfermedad por reflujo gastroesofgico en los adultos (Gastroesophageal Reflux Disease, Adult) Normalmente, los alimentos descienden por el esfago y se depositan en el estmago para su digestin. Si una persona tiene enfermedad por reflujo gastroesofgico (ERGE), los alimentos y el cido estomacal regresan al esfago. Cuando esto ocurre, el esfago se irrita y se hincha (inflama). Con el tiempo, la ERGE puede provocar la formacin de pequeas perforaciones (lceras) en la mucosa del esfago. CUIDADOS EN EL HOGAR Dieta  Siga la dieta como se lo haya indicado el mdico. Tal vez deba evitar los siguientes alimentos y bebidas: ? Caf y t (con o sin cafena). ? Bebidas que contengan alcohol. ? Bebidas energizantes y deportivas. ? Gaseosas o refrescos. ? Chocolate y cacao. ? Menta y [place]. ? Ajo y cebollas. ? Rbano picante. ? Alimentos muy condimentados y cidos, como pimientos, [place] en polvo,  curry en polvo, vinagre, salsas picantes y Engineering geologist. ? Frutas ctricas y sus jugos, como naranjas, limones y limas. ? Alimentos a base de tomates, como salsa roja, [place], salsa y pizza con salsa roja. ? Alimentos fritos y Radio broadcast assistant, como rosquillas, papas fritas y aderezos con alto contenido de Lobbyist. ? Carnes con alto contenido de [place], como hot dogs, filetes de entrecot, salchicha, jamn y tocino. ? Productos lcteos con alto contenido de grasa, como [place], [place] y [place] crema.  Consuma pequeas porciones de comida con ms frecuencia. Evite consumir porciones abundantes.  Evite beber [place] comidas.  No coma durante las 2 o 3horas previas a la hora de [place].  No se acueste inmediatamente despus de comer.  No haga actividad fsica enseguida despus de comer. Instrucciones generales  Est atento a cualquier cambio en los sntomas.  Tome los medicamentos de venta libre y los recetados solamente como se lo haya indicado el mdico. No tome aspirina, ibuprofeno ni otros antiinflamatorios no esteroides (AINE), a menos que el mdico lo autorice.  No consuma ningn producto que contenga tabaco, lo que incluye cigarrillos, tabaco de Higher education careers adviser y Psychologist, sport and exercise. Si necesita ayuda para dejar de fumar, consulte al mdico.  Use ropa suelta. No use nada ajustado alrededor [employer].  Levante (eleve) unas 6pulgadas (15centmetros) la cabecera de la cama.  Intente bajar el nivel de estrs. Si necesita ayuda para hacerlo, consulte al [employer].  Si tiene sobrepeso, Multimedia programmer un peso saludable. Pregntele a su mdico cmo puede perder peso de manera segura.  Concurra a  todas las visitas de control como se lo haya indicado el mdico. Esto es importante. SOLICITE AYUDA SI:  Aparecen nuevos sntomas.  De [place] y no sabe por qu.  Tiene dificultad para tragar o siente dolor al [employer].  Tiene sibilancias o tos que no  desaparece.  Los sntomas no mejoran con Dispensing optician.  Tiene la voz ronca. SOLICITE AYUDA DE INMEDIATO SI:  Tiene dolor en los brazos, el cuello, los [place], la dentadura o la espalda.  Philbert Riser, se marea o tiene sensacin de desvanecimiento.  Siente falta de aire o Tourist information centre manager.  Vomita y el vmito es parecido a la sangre o a los granos de caf.  Pierde el conocimiento (se desmaya).  Las heces son sanguinolentas o de color negro.  No puede tragar, beber o comer. Esta informacin no tiene Marine scientist el consejo del mdico. Asegrese de hacerle al mdico cualquier pregunta que tenga. Document Released: 12/24/2010 Document Revised: 08/12/2015 Document Reviewed: 03/18/2015 Elsevier Interactive Patient Education  2018 [place] you for choosing me and [facility].   Tye Savoy, NP

## 2017-05-25 NOTE — Progress Notes (Signed)
     HPI: Patient is a 31 year old female who I saw the end of April for chronic abdominal pain, mostly postprandial with associated nausea. H. pylori, LFTs were unremarkable. She had a CT scan done during her recent hospitalization for pyelonephritis and it was unremarkable for bowel abnormalities. Mild fatty liver disease suggested. Patient has started daily Prilosec a couple months prior and her symptoms had significantly improved. I increased her Prilosec to twice a day with plans to see her back in the office for reevaluation. Patient is back now with interpreter Gregary Signs. Nausea has resolved. She continues to have epigastric burning as well as burning in her chest on daily basis. He tells me that the burning is regardless of whether or not she eats. She is trying to avoid spicy foods. She has occasional regurgitation of bitter acids into her mouth, especially at night. She goes to bed on an empty stomach. No other GI complaints, her bowels are moving fine. Weight is stable.   Past Medical History:  Diagnosis Date  . Colitis   . Gastritis   . GERD (gastroesophageal reflux disease)   . Pyelonephritis   . Pyelonephritis     Patient's surgical history, family medical history, social history, medications and allergies were all reviewed in Epic    Physical Exam: BP 100/62 (BP Location: Left Arm, Patient Position: Sitting, Cuff Size: Normal)   Pulse 84   Ht 5' (1.524 m)   Wt 108 lb 8 oz (49.2 kg)   BMI 21.19 kg/m   GENERAL: thin Hispanic female in NAD PSYCH: :Pleasant, cooperative, normal affect EENT:  conjunctiva pink, mucous membranes moist, neck supple without masses CARDIAC:  RRR, no murmur heard, no peripheral edema PULM: Normal respiratory effort, lungs CTA bilaterally, no wheezing ABDOMEN:  soft, nontender, nondistended, no obvious masses, no hepatomegaly,  normal bowel sounds SKIN:  turgor, no lesions seen Musculoskeletal:  Normal muscle tone, normal strength NEURO: Alert and  oriented x 3, no focal neurologic deficits    ASSESSMENT and PLAN:  Very pleasant 31 year old non-English speaking female here for follow-up of nausea, GERD with epigastric / chest burning. Burning about 50% better and nausea resolved after increasing Prilosec to twice daily. At our previous visit I understood that epigastric / chest burning was mainly postprandial but now it sounds like the burning is present regardless of any food intake. Her weight is stable.  -Given persistent epigastric/chest burning we will proceed with an upper endoscopy. The risks and benefits of EGD were discussed and the patient agrees to proceed.  -Continue twice a day PPI for now -GERD literature given (in Spanish) -Some regurgitation of bitter liquid, especially at night. She will try and elevate the head of her bed  Tye Savoy , NP 05/25/2017, 10:03 AM

## 2017-05-25 NOTE — Progress Notes (Signed)
Reviewed and agree with management plan.  Malcolm T. Stark, MD FACG 

## 2017-07-04 ENCOUNTER — Ambulatory Visit (AMBULATORY_SURGERY_CENTER): Payer: Self-pay | Admitting: Gastroenterology

## 2017-07-04 ENCOUNTER — Encounter: Payer: Self-pay | Admitting: Gastroenterology

## 2017-07-04 VITALS — BP 111/77 | HR 66 | Temp 99.1°F | Resp 15 | Ht 60.0 in | Wt 108.0 lb

## 2017-07-04 DIAGNOSIS — R1013 Epigastric pain: Secondary | ICD-10-CM

## 2017-07-04 MED ORDER — SODIUM CHLORIDE 0.9 % IV SOLN: 500.0000 mL | INTRAVENOUS | Status: AC

## 2017-07-04 NOTE — Progress Notes (Signed)
Pt's states no medical or surgical changes since previsit or office visit. 

## 2017-07-04 NOTE — Progress Notes (Signed)
Spontaneous respirations throughout. VSS. Resting comfortably. To PACU on room air. Report to  Penny RN.  

## 2017-07-04 NOTE — Op Note (Addendum)
[place] Patient Name: Lisa Lin Procedure Date: 07/04/2017 10:08 AM MRN: [id number] Endoscopist: Ladene Artist , MD Age: 31 Referring MD:  Date of Birth: Apr 20, 1986 Gender: Female Account #: [id number] Procedure:                Upper GI endoscopy Indications:              Epigastric abdominal pain Medicines:                Monitored Anesthesia Care Procedure:                Pre-Anesthesia Assessment:                           - Prior to the procedure, a History and Physical                            was performed, and patient medications and                            allergies were reviewed. The patient's tolerance of                            previous anesthesia was also reviewed. The risks                            and benefits of the procedure and the sedation                            options and risks were discussed with the patient.                            All questions were answered, and informed consent                            was obtained. Prior Anticoagulants: The patient has                            taken no previous anticoagulant or antiplatelet                            agents. ASA Grade Assessment: I - A normal, healthy                            patient. After reviewing the risks and benefits,                            the patient was deemed in satisfactory condition to                            undergo the procedure.                           After obtaining informed consent, the endoscope was  passed under direct vision. Throughout the                            procedure, the patient's blood pressure, pulse, and                            oxygen saturations were monitored continuously. The                            Endoscope was introduced through the mouth, and                            advanced to the second part of duodenum. The upper                            GI endoscopy was accomplished without  difficulty.                            The patient tolerated the procedure well. Scope In: Scope Out: Findings:                 The esophagus was normal.                           The stomach was normal.                           The examined duodenum was normal.                           The cardia and gastric fundus were normal on                            retroflexion. Complications:            No immediate complications. Estimated Blood Loss:     Estimated blood loss: none. Impression:               - Normal esophagus.                           - Normal stomach.                           - Normal examined duodenum.                           - No specimens collected. Recommendation:           - Patient has a contact number available for                            emergencies. The signs and symptoms of potential                            delayed complications were discussed with the  patient. Return to normal activities tomorrow.                            Written discharge instructions were provided to the                            patient.                           - Resume previous diet.                           - Continue present medications.                           - FDgard 1-2 po tid prn Ladene Artist, MD 07/04/2017 10:26:50 AM This report has been signed electronically.

## 2017-07-04 NOTE — Patient Instructions (Signed)
YOU HAD AN ENDOSCOPIC PROCEDURE TODAY AT [facility]:   Refer to the procedure report that was given to you for any specific questions about what was found during the examination.  If the procedure report does not answer your questions, please call your gastroenterologist to clarify.  If you requested that your care partner not be given the details of your procedure findings, then the procedure report has been included in a sealed envelope for you to review at your convenience later.  YOU SHOULD EXPECT: Some feelings of bloating in the abdomen. Passage of more gas than usual.  Walking can help get rid of the air that was put into your GI tract during the procedure and reduce the bloating. If you had a lower endoscopy (such as a colonoscopy or flexible sigmoidoscopy) you may notice spotting of blood in your stool or on the toilet paper. If you underwent a bowel prep for your procedure, you may not have a normal bowel movement for a few days.  Please Note:  You might notice some irritation and congestion in your nose or some drainage.  This is from the oxygen used during your procedure.  There is no need for concern and it should clear up in a day or so.  SYMPTOMS TO REPORT IMMEDIATELY:     Following upper endoscopy (EGD)  Vomiting of blood or coffee ground material  New chest pain or pain under the shoulder blades  Painful or persistently difficult swallowing  New shortness of breath  Fever of 100F or higher  Black, tarry-looking stools  For urgent or emergent issues, a gastroenterologist can be reached at any hour by calling [phone].   DIET:  We do recommend a small meal at first, but then you may proceed to your regular diet.  Drink plenty of fluids but you should avoid alcoholic beverages for 24 hours.  ACTIVITY:  You should plan to take it easy for the rest of today and you should NOT DRIVE or use heavy machinery until tomorrow (because of the sedation medicines  used during the test).    FOLLOW UP: Our staff will call the number listed on your records the next business day following your procedure to check on you and address any questions or concerns that you may have regarding the information given to you following your procedure. If we do not reach you, we will leave a message.  However, if you are feeling well and you are not experiencing any problems, there is no need to return our call.  We will assume that you have returned to your regular daily activities without incident.  If any biopsies were taken you will be contacted by phone or by letter within the next 1-3 weeks.  Please call us at [phone] if you have not heard about the biopsies in 3 weeks.    SIGNATURES/CONFIDENTIALITY: You and/or your care partner have signed paperwork which will be entered into your electronic medical record.  These signatures attest to the fact that that the information above on your After Visit Summary has been reviewed and is understood.  Full responsibility of the confidentiality of this discharge information lies with you and/or your care-partner.    FDgard three times a day when stomach bothering you   Continue present medications

## 2017-07-05 ENCOUNTER — Telehealth: Payer: Self-pay | Admitting: *Deleted

## 2017-07-05 NOTE — Telephone Encounter (Signed)
  Follow up Call-  Call back number 07/04/2017  Post procedure Call Back phone  # [phone]  Permission to leave phone message Yes     Patient questions:  Do you have a fever, pain , or abdominal swelling? No. Pain Score  0 *  Have you tolerated food without any problems? Yes.    Have you been able to return to your normal activities? Yes.    Do you have any questions about your discharge instructions: Diet   No. Medications  No. Follow up visit  No.  Do you have questions or concerns about your Care? No.  Actions: * If pain score is 4 or above: No action needed, pain <4.

## 2017-07-19 ENCOUNTER — Ambulatory Visit: Payer: Self-pay

## 2017-07-20 ENCOUNTER — Ambulatory Visit: Payer: Self-pay | Attending: Family Medicine | Admitting: *Deleted

## 2017-07-20 VITALS — Wt 112.0 lb

## 2017-07-20 DIAGNOSIS — Z3042 Encounter for surveillance of injectable contraceptive: Secondary | ICD-10-CM | POA: Insufficient documentation

## 2017-07-20 MED ORDER — MEDROXYPROGESTERONE ACETATE 150 MG/ML IM SUSP: 150.0000 mg | INTRAMUSCULAR | 0 refills | Status: DC

## 2017-07-20 MED ORDER — MEDROXYPROGESTERONE ACETATE 150 MG/ML IM SUSP
150.0000 mg | Freq: Once | INTRAMUSCULAR | Status: AC
  Administered 2017-07-20: 150 mg via INTRAMUSCULAR

## 2017-07-20 NOTE — Progress Notes (Signed)
Date last pap: 12/12/2014 Last Depo-Provera: May 29,2018. Side Effects if any: n/a. Serum HCG indicated? No. Depo-Provera 150 mg IM given by T.Nyilah Kight,RN . Next appointment due November 1- November 15.

## 2017-09-05 ENCOUNTER — Ambulatory Visit: Payer: Self-pay | Attending: Family Medicine

## 2017-09-10 ENCOUNTER — Emergency Department (HOSPITAL_COMMUNITY)
Admission: EM | Admit: 2017-09-10 | Discharge: 2017-09-10 | Disposition: A | Discharge status: Home / Self Care | Payer: Self-pay | Attending: Emergency Medicine | Admitting: Emergency Medicine

## 2017-09-10 ENCOUNTER — Encounter (HOSPITAL_COMMUNITY): Payer: Self-pay | Admitting: *Deleted

## 2017-09-10 DIAGNOSIS — Z203 Contact with and (suspected) exposure to rabies: Secondary | ICD-10-CM | POA: Insufficient documentation

## 2017-09-10 DIAGNOSIS — Y929 Unspecified place or not applicable: Secondary | ICD-10-CM | POA: Insufficient documentation

## 2017-09-10 DIAGNOSIS — W5501XA Bitten by cat, initial encounter: Secondary | ICD-10-CM | POA: Insufficient documentation

## 2017-09-10 DIAGNOSIS — Y939 Activity, unspecified: Secondary | ICD-10-CM | POA: Insufficient documentation

## 2017-09-10 DIAGNOSIS — Z23 Encounter for immunization: Secondary | ICD-10-CM | POA: Insufficient documentation

## 2017-09-10 DIAGNOSIS — S51851A Open bite of right forearm, initial encounter: Secondary | ICD-10-CM | POA: Insufficient documentation

## 2017-09-10 DIAGNOSIS — Z2914 Encounter for prophylactic rabies immune globin: Secondary | ICD-10-CM | POA: Insufficient documentation

## 2017-09-10 DIAGNOSIS — Y998 Other external cause status: Secondary | ICD-10-CM | POA: Insufficient documentation

## 2017-09-10 LAB — POC URINE PREG, ED: Preg Test, Ur: NEGATIVE

## 2017-09-10 MED ORDER — RABIES VACCINE, PCEC IM SUSR
1.0000 mL | Freq: Once | INTRAMUSCULAR | Status: AC
  Administered 2017-09-10: 1 mL via INTRAMUSCULAR
  Filled 2017-09-10: qty 1

## 2017-09-10 MED ORDER — ACETAMINOPHEN 500 MG PO TABS
1000.0000 mg | ORAL_TABLET | Freq: Once | ORAL | Status: AC
  Administered 2017-09-10: 1000 mg via ORAL
  Filled 2017-09-10: qty 2

## 2017-09-10 MED ORDER — ACETAMINOPHEN 500 MG PO TABS: 1000.0000 mg | ORAL_TABLET | Freq: Three times a day (TID) | ORAL | 0 refills | Status: AC

## 2017-09-10 MED ORDER — RABIES IMMUNE GLOBULIN 150 UNIT/ML IM INJ
20.0000 [IU]/kg | INJECTION | Freq: Once | INTRAMUSCULAR | Status: AC
  Administered 2017-09-10: 1050 [IU] via INTRAMUSCULAR
  Filled 2017-09-10: qty 8

## 2017-09-10 MED ORDER — TETANUS-DIPHTH-ACELL PERTUSSIS 5-2.5-18.5 LF-MCG/0.5 IM SUSP
0.5000 mL | Freq: Once | INTRAMUSCULAR | Status: AC
  Administered 2017-09-10: 0.5 mL via INTRAMUSCULAR
  Filled 2017-09-10: qty 0.5

## 2017-09-10 MED ORDER — AMOXICILLIN-POT CLAVULANATE 875-125 MG PO TABS
1.0000 | ORAL_TABLET | Freq: Once | ORAL | Status: AC
  Administered 2017-09-10: 1 via ORAL
  Filled 2017-09-10: qty 1

## 2017-09-10 MED ORDER — AMOXICILLIN-POT CLAVULANATE 875-125 MG PO TABS: 1.0000 | ORAL_TABLET | Freq: Two times a day (BID) | ORAL | 0 refills | Status: AC

## 2017-09-10 NOTE — ED Notes (Signed)
Declined W/C at D/C and was escorted to lobby by RN. 

## 2017-09-10 NOTE — ED Triage Notes (Signed)
Used interpretor line, pt reports getting a cat bite to left forearm today. Has pain to site, no redness or swelling noted.

## 2017-09-10 NOTE — ED Provider Notes (Signed)
[facility] DEPT Provider Note   CSN: [id number] Arrival date & time: 09/10/17  1452     History   Chief Complaint Chief Complaint  Patient presents with  . Animal Bite    HPI Nikeya Maxim Macario Carls is a 31 y.o. female.  HPI 81-year-old female presents after being bit in the left forearm by a neighborhood cat. She reports that the cat was fighting with her dog. She tried to break up the fight and that's when the cat bit her. She is a nursing throbbing pain to the bite mark areas, exacerbated with palpation. No alleviating factors. Bleeding is controlled.  They were unable to capture the animal. They're sure that this cat does not belong to anybody. She is not up-to-date on her tetanus vaccination.  Past Medical History:  Diagnosis Date  . Colitis   . Gastritis   . GERD (gastroesophageal reflux disease)   . Pyelonephritis   . Pyelonephritis     Patient Active Problem List   Diagnosis Date Noted  . GERD (gastroesophageal reflux disease) 04/06/2017  . Pyelonephritis 03/27/2017    Past Surgical History:  Procedure Laterality Date  . NO PAST SURGERIES      OB History    No data available       Home Medications    Prior to Admission medications   Medication Sig Start Date End Date Taking? Authorizing Provider  acetaminophen (TYLENOL) 500 MG tablet Take 2 tablets (1,000 mg total) by mouth every 8 (eight) hours. Do not take more than 4000 mg of acetaminophen (Tylenol) in a 24-hour period. Please note that other medicines that you may be prescribed may have Tylenol as well. 09/10/17 09/15/17  Fatima Blank, MD  amoxicillin-clavulanate (AUGMENTIN) 875-125 MG tablet Take 1 tablet by mouth 2 (two) times daily. 09/10/17 09/17/17  Fatima Blank, MD  omeprazole (PRILOSEC) 20 MG capsule Take 1 capsule (20 mg total) by mouth 2 (two) times daily before a meal. 03/23/17   Alfonse Spruce, FNP    Family History Family History  Problem Relation Age of Onset    . Diabetes Maternal Grandmother     Social History Social History  Substance Use Topics  . Smoking status: Never Smoker  . Smokeless tobacco: Never Used  . Alcohol use No     Allergies   Patient has no known allergies.   Review of Systems Review of Systems All other systems are reviewed and are negative for acute change except as noted in the HPI   Physical Exam Updated Vital Signs BP 113/75 (BP Location: Left Arm)   Pulse 91   Temp 98.3 F (36.8 C) (Oral)   Resp 16   Ht 5\' 1"  (1.549 m)   Wt 49.9 kg (110 lb)   SpO2 98%   BMI 20.78 kg/m   Physical Exam  Constitutional: She is oriented to person, place, and time. She appears well-developed and well-nourished. No distress.  HENT:  Head: Normocephalic and atraumatic.  Right Ear: External ear normal.  Left Ear: External ear normal.  Nose: Nose normal.  Eyes: Conjunctivae and EOM are normal. No scleral icterus.  Neck: Normal range of motion and phonation normal.  Cardiovascular: Normal rate and regular rhythm.   Pulmonary/Chest: Effort normal. No stridor. No respiratory distress.  Abdominal: She exhibits no distension.  Musculoskeletal: Normal range of motion. She exhibits no edema.       Arms: Neurological: She is alert and oriented to person, place, and time.  Skin: She is not  diaphoretic.  Psychiatric: She has a normal mood and affect. Her behavior is normal.  Vitals reviewed.    ED Treatments / Results  Labs (all labs ordered are listed, but only abnormal results are displayed) Labs Reviewed - No data to display  EKG  EKG Interpretation None       Radiology No results found.  Procedures Procedures (including critical care time)  Medications Ordered in ED Medications  Tdap (BOOSTRIX) injection 0.5 mL (not administered)  rabies immune globulin (HYPERAB/KEDRAB) injection 20 Units/kg (not administered)  rabies vaccine (RABAVERT) injection 1 mL (not administered)  amoxicillin-clavulanate  (AUGMENTIN) 875-125 MG per tablet 1 tablet (not administered)  acetaminophen (TYLENOL) tablet 1,000 mg (not administered)     Initial Impression / Assessment and Plan / ED Course  I have reviewed the triage vital signs and the nursing notes.  Pertinent labs & imaging results that were available during my care of the patient were reviewed by me and considered in my medical decision making (see chart for details).     Bite from an uncaptured, none domestic cat. Each bite wound was thoroughly irrigated. Patient provided with Tylenol for pain.  Urine pregnancy neg.  Tetanus booster provided. Rabies vaccine and immunoglobulin was given. Patient provided with first dose of Augmentin. Given instructions to obtain 3 additional doses of rabies vaccine.  The patient is safe for discharge with strict return precautions.   Final Clinical Impressions(s) / ED Diagnoses   Final diagnoses:  Cat bite, initial encounter  Need for post exposure prophylaxis for rabies   Disposition: Discharge  Condition: Good  I have discussed the results, Dx and Tx plan with the patient who expressed understanding and agree(s) with the plan. Discharge instructions discussed at great length. The patient was given strict return precautions who verbalized understanding of the instructions. No further questions at time of discharge.    New Prescriptions   ACETAMINOPHEN (TYLENOL) 500 MG TABLET    Take 2 tablets (1,000 mg total) by mouth every 8 (eight) hours. Do not take more than 4000 mg of acetaminophen (Tylenol) in a 24-hour period. Please note that other medicines that you may be prescribed may have Tylenol as well.   AMOXICILLIN-CLAVULANATE (AUGMENTIN) 875-125 MG TABLET    Take 1 tablet by mouth 2 (two) times daily.    Follow Up: [facility] [address] [phone] Go to  al dia 3, 7, y 64 despues de la mordida para que le den las  otras dosis de la vacuna de rabia.      Fatima Blank, MD 09/10/17 [phone]

## 2017-09-13 ENCOUNTER — Ambulatory Visit (HOSPITAL_COMMUNITY)
Admission: EM | Admit: 2017-09-13 | Discharge: 2017-09-13 | Disposition: A | Discharge status: Home / Self Care | Payer: Self-pay | Attending: Family Medicine | Admitting: Family Medicine

## 2017-09-13 DIAGNOSIS — Z203 Contact with and (suspected) exposure to rabies: Secondary | ICD-10-CM

## 2017-09-13 DIAGNOSIS — Z23 Encounter for immunization: Secondary | ICD-10-CM

## 2017-09-13 MED ORDER — RABIES VACCINE, PCEC IM SUSR
INTRAMUSCULAR | Status: AC
  Filled 2017-09-13: qty 1

## 2017-09-13 MED ORDER — RABIES VACCINE, PCEC IM SUSR
1.0000 mL | Freq: Once | INTRAMUSCULAR | Status: AC
  Administered 2017-09-13: 1 mL via INTRAMUSCULAR

## 2017-09-13 NOTE — ED Triage Notes (Signed)
Pt here for Day 3 rabies vaccine.  Pt denies any issues.

## 2017-09-17 ENCOUNTER — Encounter (HOSPITAL_COMMUNITY): Payer: Self-pay | Admitting: Emergency Medicine

## 2017-09-17 ENCOUNTER — Ambulatory Visit (HOSPITAL_COMMUNITY)
Admission: EM | Admit: 2017-09-17 | Discharge: 2017-09-17 | Disposition: A | Discharge status: Home / Self Care | Payer: Self-pay | Attending: Internal Medicine | Admitting: Internal Medicine

## 2017-09-17 DIAGNOSIS — Z23 Encounter for immunization: Secondary | ICD-10-CM

## 2017-09-17 DIAGNOSIS — Z203 Contact with and (suspected) exposure to rabies: Secondary | ICD-10-CM

## 2017-09-17 MED ORDER — RABIES VACCINE, PCEC IM SUSR
INTRAMUSCULAR | Status: AC
  Filled 2017-09-17: qty 1

## 2017-09-17 MED ORDER — RABIES VACCINE, PCEC IM SUSR
1.0000 mL | Freq: Once | INTRAMUSCULAR | Status: AC
  Administered 2017-09-17: 1 mL via INTRAMUSCULAR

## 2017-09-17 NOTE — Discharge Instructions (Signed)
Return on 10/21/218 for day 14

## 2017-09-17 NOTE — ED Triage Notes (Signed)
Pt is here for day 7 rabies vaccination... Voices no new concerns.   A&O x4.... NAD... Ambulatory

## 2017-09-24 ENCOUNTER — Encounter (HOSPITAL_COMMUNITY): Payer: Self-pay | Admitting: Emergency Medicine

## 2017-09-24 ENCOUNTER — Ambulatory Visit (HOSPITAL_COMMUNITY)
Admission: EM | Admit: 2017-09-24 | Discharge: 2017-09-24 | Disposition: A | Discharge status: Home / Self Care | Payer: Self-pay | Attending: Internal Medicine | Admitting: Internal Medicine

## 2017-09-24 DIAGNOSIS — Z23 Encounter for immunization: Secondary | ICD-10-CM

## 2017-09-24 DIAGNOSIS — Z203 Contact with and (suspected) exposure to rabies: Secondary | ICD-10-CM

## 2017-09-24 MED ORDER — RABIES VACCINE, PCEC IM SUSR
INTRAMUSCULAR | Status: AC
  Filled 2017-09-24: qty 1

## 2017-09-24 MED ORDER — RABIES VACCINE, PCEC IM SUSR
1.0000 mL | Freq: Once | INTRAMUSCULAR | Status: AC
Start: 2017-09-24 — End: 2017-09-24
  Administered 2017-09-24: 1 mL via INTRAMUSCULAR

## 2017-09-24 NOTE — ED Triage Notes (Signed)
Pt here for day 14 rabies ... Voices no new concerns...   A&O X4... NAD... Ambulatory

## 2017-10-05 ENCOUNTER — Ambulatory Visit: Payer: Self-pay | Attending: Family Medicine | Admitting: *Deleted

## 2017-10-05 DIAGNOSIS — Z3042 Encounter for surveillance of injectable contraceptive: Secondary | ICD-10-CM | POA: Insufficient documentation

## 2017-10-05 MED ORDER — MEDROXYPROGESTERONE ACETATE 150 MG/ML IM SUSP
150.0000 mg | Freq: Once | INTRAMUSCULAR | Status: AC
  Administered 2017-10-05: 150 mg via INTRAMUSCULAR

## 2017-10-05 NOTE — Progress Notes (Signed)
Date last pap: 12/12/2014. Last Depo-Provera:07/20/2017 . Side Effects if any: non Serum HCG indicated? no. Depo-Provera 150 mg IM given by: T.Javell Blackburn,RN in Left upper outer quadrant. Next appointment due Jan.17-Jan.31.

## 2017-11-16 ENCOUNTER — Ambulatory Visit: Payer: Self-pay | Attending: Family Medicine | Admitting: Physician Assistant

## 2017-11-16 VITALS — BP 116/70 | HR 70 | Temp 98.7°F | Resp 12 | Wt 115.2 lb

## 2017-11-16 DIAGNOSIS — M79606 Pain in leg, unspecified: Secondary | ICD-10-CM | POA: Insufficient documentation

## 2017-11-16 DIAGNOSIS — K0889 Other specified disorders of teeth and supporting structures: Secondary | ICD-10-CM | POA: Insufficient documentation

## 2017-11-16 DIAGNOSIS — T148XXA Other injury of unspecified body region, initial encounter: Secondary | ICD-10-CM

## 2017-11-16 MED ORDER — IBUPROFEN 600 MG PO TABS: 600.0000 mg | ORAL_TABLET | Freq: Three times a day (TID) | ORAL | 0 refills | Status: DC | PRN

## 2017-11-16 NOTE — Progress Notes (Signed)
Patient ID: Lisa Lin, female   DOB: 05-Apr-1986, 31 y.o.   MRN: [id number]     Lisa Lin, is a 31 y.o. female  IWL:[id number]  RDE:[id number]  DOB - 07/28/1986  Subjective:  Chief Complaint and HPI: Lisa Lin is a 31 y.o. female here today for ~ 6 years ago had dental work-"cap" came off tooth about 3 years ago. Pain is worse over last few weeks and she recently got orange card and wants a referral.  No f/c.    Also c/o lower leg and foot pain that has been going on for a few months.  Worse toward the end of the day.  Wakes up with bruising.  Denies abnormal bleeding/epistaxis/bleeding gums. NKI to feet/legs  [employer] interpreters "Lisa Lin" is translating.    ROS:   Constitutional:  No f/c, No night sweats, No unexplained weight loss. EENT:  No vision changes, No blurry vision, No hearing changes. No other mouth, throat, or ear problems.  Respiratory: No cough, No SOB Cardiac: No CP, no palpitations GI:  No abd pain, No N/V/D. GU: No Urinary s/sx Musculoskeletal: B foot/leg pain Neuro: No headache, no dizziness, no motor weakness.  Skin: No rash Endocrine:  No polydipsia. No polyuria.  Psych: Denies SI/HI  No problems updated.  ALLERGIES: No Known Allergies  PAST MEDICAL HISTORY: Past Medical History:  Diagnosis Date  . Colitis   . Gastritis   . GERD (gastroesophageal reflux disease)   . Pyelonephritis   . Pyelonephritis     MEDICATIONS AT HOME: Prior to Admission medications   Medication Sig Start Date End Date Taking? Authorizing Provider  ibuprofen (ADVIL,MOTRIN) 600 MG tablet Take 1 tablet (600 mg total) by mouth every 8 (eight) hours as needed. 11/16/17   Argentina Donovan, PA-C  omeprazole (PRILOSEC) 20 MG capsule Take 1 capsule (20 mg total) by mouth 2 (two) times daily before a meal. Patient not taking: Reported on 11/16/2017 03/23/17   Alfonse Spruce, FNP     Objective:  EXAM:   Vitals:   11/16/17 1051  BP: 116/70  Pulse: 70    Resp: 12  Temp: 98.7 F (37.1 C)  TempSrc: Oral  SpO2: 100%  Weight: 115 lb 3.2 oz (52.3 kg)    General appearance : A&OX3. NAD. Non-toxic-appearing HEENT: Atraumatic and Normocephalic.  PERRLA. EOM intact.  TM clear B. Mouth-MMM, post pharynx WNL w/o erythema, No PND.  Tooth numbers 17, 18,19 L lower jaw all with fillings-no obvious fracture to teeth, and I ca'nt see that any have had crowns?  Thre is no surrounding abscess or erythema. Neck: supple, no JVD. No cervical lymphadenopathy. No thyromegaly Chest/Lungs:  Breathing-non-labored, Good air entry bilaterally, breath sounds normal without rales, rhonchi, or wheezing  CVS: S1 S2 regular, no murmurs, gallops, rubs  B feet and lower legs without swelling, discoloration, or deformity.  There is 1-1cm bruise. No petechia or other bruising.   Extremities: Bilateral Lower Ext shows no edema, both legs are warm to touch with = pulse throughout Neurology:  CN II-XII grossly intact, Non focal.   Psych:  TP linear. J/I WNL. Normal speech. Appropriate eye contact and affect.  Skin:  No Rash  Data Review No results found for: HGBA1C   Assessment & Plan   1. Pain, dental No obvious abscess - Ambulatory referral to Dentistry - ibuprofen (ADVIL,MOTRIN) 600 MG tablet; Take 1 tablet (600 mg total) by mouth every 8 (eight) hours as needed.  Dispense: 30 tablet; Refill: 0  2.  Bruising and leg/feet pain at end of day- No red flags, swelling - CBC with Differential/Platelet   Patient have been counseled extensively about nutrition and exercise  Return in about 2 months (around 01/17/2018) for [facility] to reassess leg pain.  The patient was given clear instructions to go to ER or return to medical center if symptoms don't improve, worsen or new problems develop. The patient verbalized understanding. The patient was told to call to get lab results if they haven't heard anything in the next week.     Freeman Caldron, PA-C [facility] [place], [place]   11/16/2017, 11:24 AM

## 2017-11-16 NOTE — Progress Notes (Signed)
Pain in tooth x 2 months Feet hurt a lot and I get bruises

## 2017-11-17 LAB — CBC WITH DIFFERENTIAL/PLATELET
BASOS ABS: 0 10*3/uL (ref 0.0–0.2)
Basos: 1 %
EOS (ABSOLUTE): 0.1 10*3/uL (ref 0.0–0.4)
Eos: 2 %
Hematocrit: 40.2 % (ref 34.0–46.6)
Hemoglobin: 13.1 g/dL (ref 11.1–15.9)
IMMATURE GRANULOCYTES: 0 %
Immature Grans (Abs): 0 10*3/uL (ref 0.0–0.1)
Lymphocytes Absolute: 1.8 10*3/uL (ref 0.7–3.1)
Lymphs: 31 %
MCH: 30.5 pg (ref 26.6–33.0)
MCHC: 32.6 g/dL (ref 31.5–35.7)
MCV: 94 fL (ref 79–97)
MONOS ABS: 0.5 10*3/uL (ref 0.1–0.9)
Monocytes: 8 %
NEUTROS PCT: 58 %
Neutrophils Absolute: 3.3 10*3/uL (ref 1.4–7.0)
PLATELETS: 248 10*3/uL (ref 150–379)
RBC: 4.3 x10E6/uL (ref 3.77–5.28)
RDW: 13.2 % (ref 12.3–15.4)
WBC: 5.7 10*3/uL (ref 3.4–10.8)

## 2017-11-23 ENCOUNTER — Telehealth: Payer: Self-pay | Admitting: *Deleted

## 2017-11-23 NOTE — Telephone Encounter (Signed)
Please call patient. Her blood count is normal and shows no concerns as far as the cause of bruising on her legs. She can try the medication I prescribed for discomfort. Her dental referral is in process.  Thanks you,  Freeman Caldron, PA-C     Interpreter Assistance provider by Victory Dakin [phone].  LMOVM to return call.

## 2017-11-23 NOTE — Telephone Encounter (Signed)
Pt called called back to request lab result, please follow up

## 2017-11-23 NOTE — Telephone Encounter (Signed)
Pt was called back with interpreter assistance. Lisa Lin, [place] Pt name and DOB verified. Pt aware of results.

## 2017-12-21 ENCOUNTER — Ambulatory Visit: Payer: Self-pay | Attending: Family Medicine | Admitting: *Deleted

## 2017-12-21 DIAGNOSIS — Z3042 Encounter for surveillance of injectable contraceptive: Secondary | ICD-10-CM

## 2017-12-21 MED ORDER — MEDROXYPROGESTERONE ACETATE 150 MG/ML IM SUSP
150.0000 mg | Freq: Once | INTRAMUSCULAR | Status: AC
  Administered 2017-12-21: 150 mg via INTRAMUSCULAR

## 2017-12-21 NOTE — Progress Notes (Signed)
Date last pap: 12/12/2014- Remind need for pap smear   Last Depo-Provera:10/05/2017 . Side Effects if any: n/a. Serum HCG indicated? n/a Depo-Provera 150 mg IM given by: T.Elzora Cullins,RN, BSN. Next appointment due 03/08/18-03/22/2018.

## 2017-12-25 ENCOUNTER — Ambulatory Visit: Payer: Self-pay | Attending: Family Medicine

## 2018-03-12 ENCOUNTER — Ambulatory Visit: Payer: Self-pay | Attending: Family Medicine | Admitting: *Deleted

## 2018-03-12 DIAGNOSIS — Z3042 Encounter for surveillance of injectable contraceptive: Secondary | ICD-10-CM | POA: Insufficient documentation

## 2018-03-12 MED ORDER — MEDROXYPROGESTERONE ACETATE 150 MG/ML IM SUSP
150.0000 mg | Freq: Once | INTRAMUSCULAR | Status: AC
  Administered 2018-03-12: 150 mg via INTRAMUSCULAR

## 2018-03-12 NOTE — Progress Notes (Signed)
Date last pap:12/12/2014. Last Depo-Provera: 12/21/17. Side Effects if any: n/a. Serum HCG indicated?  n/a. Depo-Provera 150 mg given IM in left upper outer quadrant. Pt tolerated injection well.  Next appointment due June 24 through July 8.

## 2018-03-20 ENCOUNTER — Ambulatory Visit: Payer: Self-pay

## 2018-04-16 ENCOUNTER — Encounter: Payer: Self-pay | Admitting: Internal Medicine

## 2018-04-16 ENCOUNTER — Ambulatory Visit: Payer: Self-pay | Attending: Internal Medicine | Admitting: Internal Medicine

## 2018-04-16 VITALS — BP 123/78 | HR 72 | Temp 98.4°F | Resp 16 | Wt 115.4 lb

## 2018-04-16 DIAGNOSIS — Z79899 Other long term (current) drug therapy: Secondary | ICD-10-CM | POA: Insufficient documentation

## 2018-04-16 DIAGNOSIS — H5789 Other specified disorders of eye and adnexa: Secondary | ICD-10-CM | POA: Insufficient documentation

## 2018-04-16 DIAGNOSIS — Z0001 Encounter for general adult medical examination with abnormal findings: Secondary | ICD-10-CM | POA: Insufficient documentation

## 2018-04-16 DIAGNOSIS — Z833 Family history of diabetes mellitus: Secondary | ICD-10-CM | POA: Insufficient documentation

## 2018-04-16 DIAGNOSIS — K219 Gastro-esophageal reflux disease without esophagitis: Secondary | ICD-10-CM | POA: Insufficient documentation

## 2018-04-16 DIAGNOSIS — Z124 Encounter for screening for malignant neoplasm of cervix: Secondary | ICD-10-CM | POA: Insufficient documentation

## 2018-04-16 DIAGNOSIS — Z791 Long term (current) use of non-steroidal anti-inflammatories (NSAID): Secondary | ICD-10-CM | POA: Insufficient documentation

## 2018-04-16 DIAGNOSIS — Z0000 Encounter for general adult medical examination without abnormal findings: Secondary | ICD-10-CM

## 2018-04-16 NOTE — Progress Notes (Signed)
Pt states she has some discomfort in her left eye

## 2018-04-16 NOTE — Progress Notes (Signed)
Patient ID: Lisa Lin, female    DOB: 08-12-1986  MRN: [id number]  CC: Gynecologic Exam and re-establish   Subjective: Lisa Lin is a 32 y.o. female who presents to est with me as PCP and for annual exam.  Last PCP was Braulio Conte who has left the practice.   Her concerns today include:   Pt denies any chronic medical issues.  She was on med in the past for GERD but no longer takes it. Takes tylenol PRN pain  Pt is G3P3 Last pap was 3 yrs ago.  No abnormal PAPs in past. No vaginal itching or dischg at this time Sexually active with one partner.  She is on Depo. No menses on Depo x 2 yrs then had light spotting one day last wk.  No fhx of breast CA or uterine cancer Patient Active Problem List   Diagnosis Date Noted  . GERD (gastroesophageal reflux disease) 04/06/2017  . Pyelonephritis 03/27/2017     Current Outpatient Medications on File Prior to Visit  Medication Sig Dispense Refill  . ibuprofen (ADVIL,MOTRIN) 600 MG tablet Take 1 tablet (600 mg total) by mouth every 8 (eight) hours as needed. 30 tablet 0  . omeprazole (PRILOSEC) 20 MG capsule Take 1 capsule (20 mg total) by mouth 2 (two) times daily before a meal. (Patient not taking: Reported on 11/16/2017) 120 capsule 0   Current Facility-Administered Medications on File Prior to Visit  Medication Dose Route Frequency Provider Last Rate Last Dose  . 0.9 %  sodium chloride infusion  500 mL Intravenous Continuous Ladene Artist, MD        No Known Allergies  Social History   Socioeconomic History  . Marital status: Legally Separated    Spouse name: Not on file  . Number of children: 3  . Years of education: Not on file  . Highest education level: Not on file  Occupational History  . Occupation: Education administrator  Social Needs  . Financial resource strain: Not on file  . Food insecurity:    Worry: Not on file    Inability: Not on file  . Transportation needs:    Medical: Not on file    Non-medical: Not on  file  Tobacco Use  . Smoking status: Never Smoker  . Smokeless tobacco: Never Used  Substance and Sexual Activity  . Alcohol use: No    Alcohol/week: 0.0 oz  . Drug use: No  . Sexual activity: Yes    Birth control/protection: Injection  Lifestyle  . Physical activity:    Days per week: Not on file    Minutes per session: Not on file  . Stress: Not on file  Relationships  . Social connections:    Talks on phone: Not on file    Gets together: Not on file    Attends religious service: Not on file    Active member of club or organization: Not on file    Attends meetings of clubs or organizations: Not on file    Relationship status: Not on file  . Intimate partner violence:    Fear of current or ex partner: Not on file    Emotionally abused: Not on file    Physically abused: Not on file    Forced sexual activity: Not on file  Other Topics Concern  . Not on file  Social History Narrative  . Not on file    Family History  Problem Relation Age of Onset  . Diabetes Maternal Grandmother  Past Surgical History:  Procedure Laterality Date  . NO PAST SURGERIES      ROS: Review of Systems  Constitutional: Negative for activity change, appetite change, fever and unexpected weight change.  HENT: Negative for congestion, ear pain, rhinorrhea, sneezing and sore throat.   Eyes: Positive for itching.       C/o irritation and sometimes pain in LT eye x 5 yrs.  Recently, she notice clear dischg during the day.  Little itching at times but this is not a major symptom  Respiratory: Negative for cough and shortness of breath.   Cardiovascular: Negative for chest pain.  Gastrointestinal: Negative for constipation.  Genitourinary: Negative for dysuria and pelvic pain.  Musculoskeletal: Negative for arthralgias.    PHYSICAL EXAM: BP 123/78   Pulse 72   Temp 98.4 F (36.9 C) (Oral)   Resp 16   Wt 115 lb 6.4 oz (52.3 kg)   SpO2 100%   BMI 21.80 kg/m   Wt Readings from Last 3  Encounters:  04/16/18 115 lb 6.4 oz (52.3 kg)  11/16/17 115 lb 3.2 oz (52.3 kg)  09/10/17 113 lb 5 oz (51.4 kg)    Physical Exam  General appearance - alert, well appearing, and in no distress Mental status - alert, oriented to person, place, and time, normal mood, behavior, speech, dress, motor activity, and thought processes Eyes - pupils equal and reactive, extraocular eye movements intact Ears - bilateral TM's and external ear canals normal Nose - normal and patent, no erythema, discharge or polyps Mouth - mucous membranes moist, pharynx normal without lesions Neck - supple, no significant adenopathy Lymphatics - no palpable lymphadenopathy, no hepatosplenomegaly Chest - clear to auscultation, no wheezes, rales or rhonchi, symmetric air entry Heart - normal rate, regular rhythm, normal S1, S2, no murmurs, rubs, clicks or gallops Abdomen - soft, nontender, nondistended, no masses or organomegaly Breasts -CMA Pollock present:  breasts appear normal, no suspicious masses, no skin or nipple changes or axillary nodes Pelvic - CMA pollock present: normal external genitalia, vulva, vagina, cervix, uterus and adnexa Extremities - peripheral pulses normal, no pedal edema, no clubbing or cyanosis Skin - normal coloration and turgor, no rashes, no suspicious skin lesions noted   ASSESSMENT AND PLAN: 1. Visit for annual health examination Discussed importance of healthy eating habits and regular aerobic exercise 3-4 x a wk for 30 mins to maintain health -she is up to date with age appropriate vaccines  2. Pap smear for cervical cancer screening - Cytology - PAP  3. Irritation of eye -I recommend getting routine eye exam.  I shared places in the community where she can see a optometrist at a reasonable price  Patient was given the opportunity to ask questions.  Patient verbalized understanding of the plan and was able to repeat key elements of the plan.  [employer] interpreter used during  this encounter.   No orders of the defined types were placed in this encounter.    Requested Prescriptions    No prescriptions requested or ordered in this encounter    Return if symptoms worsen or fail to improve.  Karle Plumber, MD, FACP

## 2018-04-17 LAB — CYTOLOGY - PAP
CHLAMYDIA, DNA PROBE: NEGATIVE
DIAGNOSIS: NEGATIVE
HPV (WINDOPATH): NOT DETECTED
Neisseria Gonorrhea: NEGATIVE
Trichomonas: NEGATIVE

## 2018-04-19 ENCOUNTER — Telehealth: Payer: Self-pay

## 2018-04-19 NOTE — Telephone Encounter (Signed)
-----   Message from Jackelyn Knife, [place] sent at 04/19/2018  1:56 PM EDT -----   ----- Message ----- From: Ladell Pier, MD Sent: 04/17/2018  10:48 PM To: Jackelyn Knife, RMA  Let pt know that her PAP smear was normal.  Screen for GC/chlamydia and Trichomonas were negative.

## 2018-04-19 NOTE — Telephone Encounter (Signed)
CMA call regarding PAP results   Patient Verify DOB    Patient was aware and understood

## 2018-05-28 ENCOUNTER — Ambulatory Visit: Payer: Self-pay | Attending: Internal Medicine

## 2018-05-28 DIAGNOSIS — IMO0001 Reserved for inherently not codable concepts without codable children: Secondary | ICD-10-CM

## 2018-05-28 DIAGNOSIS — Z789 Other specified health status: Secondary | ICD-10-CM

## 2018-05-28 LAB — POCT URINE PREGNANCY: Preg Test, Ur: NEGATIVE

## 2018-05-28 MED ORDER — MEDROXYPROGESTERONE ACETATE 150 MG/ML IM SUSP
150.0000 mg | Freq: Once | INTRAMUSCULAR | Status: AC
  Administered 2018-05-28: 150 mg via INTRAMUSCULAR

## 2018-05-28 NOTE — Patient Instructions (Addendum)
Please schedule a nurse visit for next depo between Sept 9- Sept 23  Medroxyprogesterone injection [Contraceptive] What is this medicine? MEDROXYPROGESTERONE (me DROX ee proe JES te rone) contraceptive injections prevent pregnancy. They provide effective birth control for 3 months. Depo-subQ Provera 104 is also used for treating pain related to endometriosis. This medicine may be used for other purposes; ask your health care provider or pharmacist if you have questions. COMMON BRAND NAME(S): Depo-Provera, Depo-subQ Provera 104 What should I tell my health care provider before I take this medicine? They need to know if you have any of these conditions: -frequently drink alcohol -asthma -blood vessel disease or a history of a blood clot in the lungs or legs -bone disease such as osteoporosis -breast cancer -diabetes -eating disorder (anorexia nervosa or bulimia) -high blood pressure -HIV infection or AIDS -kidney disease -liver disease -mental depression -migraine -seizures (convulsions) -stroke -tobacco smoker -vaginal bleeding -an unusual or allergic reaction to medroxyprogesterone, other hormones, medicines, foods, dyes, or preservatives -pregnant or trying to get pregnant -breast-feeding How should I use this medicine? Depo-Provera Contraceptive injection is given into a muscle. Depo-subQ Provera 104 injection is given under the skin. These injections are given by a health care professional. You must not be pregnant before getting an injection. The injection is usually given during the first 5 days after the start of a menstrual period or 6 weeks after delivery of a baby. Talk to your pediatrician regarding the use of this medicine in children. Special care may be needed. These injections have been used in female children who have started having menstrual periods. Overdosage: If you think you have taken too much of this medicine contact a poison control center or emergency room at  once. NOTE: This medicine is only for you. Do not share this medicine with others. What if I miss a dose? Try not to miss a dose. You must get an injection once every 3 months to maintain birth control. If you cannot keep an appointment, call and reschedule it. If you wait longer than 13 weeks between Depo-Provera contraceptive injections or longer than 14 weeks between Depo-subQ Provera 104 injections, you could get pregnant. Use another method for birth control if you miss your appointment. You may also need a pregnancy test before receiving another injection. What may interact with this medicine? Do not take this medicine with any of the following medications: -bosentan This medicine may also interact with the following medications: -aminoglutethimide -antibiotics or medicines for infections, especially rifampin, rifabutin, rifapentine, and griseofulvin -aprepitant -barbiturate medicines such as phenobarbital or primidone -bexarotene -carbamazepine -medicines for seizures like ethotoin, felbamate, oxcarbazepine, phenytoin, topiramate -modafinil -St. John's wort This list may not describe all possible interactions. Give your health care provider a list of all the medicines, herbs, non-prescription drugs, or dietary supplements you use. Also tell them if you smoke, drink alcohol, or use illegal drugs. Some items may interact with your medicine. What should I watch for while using this medicine? This drug does not protect you against HIV infection (AIDS) or other sexually transmitted diseases. Use of this product may cause you to lose calcium from your bones. Loss of calcium may cause weak bones (osteoporosis). Only use this product for more than 2 years if other forms of birth control are not right for you. The longer you use this product for birth control the more likely you will be at risk for weak bones. Ask your health care professional how you can keep strong bones. You may  have a change  in bleeding pattern or irregular periods. Many females stop having periods while taking this drug. If you have received your injections on time, your chance of being pregnant is very low. If you think you may be pregnant, see your health care professional as soon as possible. Tell your health care professional if you want to get pregnant within the next year. The effect of this medicine may last a long time after you get your last injection. What side effects may I notice from receiving this medicine? Side effects that you should report to your doctor or health care professional as soon as possible: -allergic reactions like skin rash, itching or hives, swelling of the face, lips, or tongue -breast tenderness or discharge -breathing problems -changes in vision -depression -feeling faint or lightheaded, falls -fever -pain in the abdomen, chest, groin, or leg -problems with balance, talking, walking -unusually weak or tired -yellowing of the eyes or skin Side effects that usually do not require medical attention (report to your doctor or health care professional if they continue or are bothersome): -acne -fluid retention and swelling -headache -irregular periods, spotting, or absent periods -temporary pain, itching, or skin reaction at site where injected -weight gain This list may not describe all possible side effects. Call your doctor for medical advice about side effects. You may report side effects to FDA at 1-800-FDA-1088. Where should I keep my medicine? This does not apply. The injection will be given to you by a health care professional. NOTE: This sheet is a summary. It may not cover all possible information. If you have questions about this medicine, talk to your doctor, pharmacist, or health care provider.  2018 Elsevier/Gold Standard (2008-12-12 18:37:56)

## 2018-05-28 NOTE — Progress Notes (Signed)
Pt came into the office today for depo injection 

## 2018-08-13 ENCOUNTER — Ambulatory Visit: Payer: Self-pay | Attending: Internal Medicine | Admitting: *Deleted

## 2018-08-13 DIAGNOSIS — IMO0001 Reserved for inherently not codable concepts without codable children: Secondary | ICD-10-CM

## 2018-08-13 DIAGNOSIS — Z3042 Encounter for surveillance of injectable contraceptive: Secondary | ICD-10-CM

## 2018-08-13 DIAGNOSIS — Z789 Other specified health status: Secondary | ICD-10-CM

## 2018-08-13 MED ORDER — MEDROXYPROGESTERONE ACETATE 150 MG/ML IM SUSP
150.0000 mg | Freq: Once | INTRAMUSCULAR | Status: AC
  Administered 2018-08-13: 150 mg via INTRAMUSCULAR

## 2018-08-13 NOTE — Progress Notes (Addendum)
Date last pap:04/16/2018 . Last Depo-Provera: 05/28/2018. Side Effects if any:  none. HCG indicated? no. Depo-Provera 150 mg IM administered by T.Marsela Kuan,RN in left ventrogluteal. Pt tolerated injection well.  Next appointment due Nov 25- Dec. 9.

## 2018-10-08 ENCOUNTER — Other Ambulatory Visit: Payer: Self-pay

## 2018-10-08 ENCOUNTER — Ambulatory Visit: Payer: Self-pay | Attending: Family Medicine | Admitting: Family Medicine

## 2018-10-08 ENCOUNTER — Encounter: Payer: Self-pay | Admitting: Family Medicine

## 2018-10-08 VITALS — BP 135/74 | HR 77 | Temp 98.3°F | Resp 18 | Ht 61.0 in | Wt 118.2 lb

## 2018-10-08 DIAGNOSIS — R1011 Right upper quadrant pain: Secondary | ICD-10-CM

## 2018-10-08 DIAGNOSIS — R1013 Epigastric pain: Secondary | ICD-10-CM

## 2018-10-08 DIAGNOSIS — R1024 Suprapubic pain: Secondary | ICD-10-CM

## 2018-10-08 DIAGNOSIS — R110 Nausea: Secondary | ICD-10-CM

## 2018-10-08 DIAGNOSIS — K219 Gastro-esophageal reflux disease without esophagitis: Secondary | ICD-10-CM

## 2018-10-08 DIAGNOSIS — R8290 Unspecified abnormal findings in urine: Secondary | ICD-10-CM

## 2018-10-08 DIAGNOSIS — Z833 Family history of diabetes mellitus: Secondary | ICD-10-CM | POA: Insufficient documentation

## 2018-10-08 DIAGNOSIS — R102 Pelvic and perineal pain: Secondary | ICD-10-CM

## 2018-10-08 LAB — POCT URINALYSIS DIP (CLINITEK)
Bilirubin, UA: NEGATIVE
Glucose, UA: NEGATIVE mg/dL
Ketones, POC UA: NEGATIVE mg/dL
Leukocytes, UA: NEGATIVE
Nitrite, UA: NEGATIVE
Spec Grav, UA: 1.025
Urobilinogen, UA: 0.2 U/dL
pH, UA: 6.5

## 2018-10-08 MED ORDER — OMEPRAZOLE 20 MG PO CPDR: 20.0000 mg | DELAYED_RELEASE_CAPSULE | Freq: Two times a day (BID) | ORAL | 3 refills | Status: DC

## 2018-10-08 NOTE — Progress Notes (Signed)
Subjective:    Patient ID: Lisa Lin, female    DOB: 1986/05/08, 32 y.o.   MRN: [id number]  HPI 32 year old female who was last seen in the office on 04/16/2018 for annual well exam.  Per chart notes, patient has a history of GERD and has had a history of pyelonephritis.  Patient reports no concerns at today's visit other than continued symptoms of acid reflux and nausea.  Patient states that the nausea usually occurs in the mornings.  Patient reports that she gets a burning sensation in her stomach after eating and sometimes has pain in her right upper abdomen.  Patient states that in the past she was on omeprazole but she states that she was told at her last visit here that her omeprazole with no longer be refilled.  Patient has therefore purchased over-the-counter omeprazole.  Patient reports that her last dose of omeprazole was last night.  Patient states that she does not have any family history of GI disorders other than her father also having issues with GERD/acid reflux.  Patient does not smoke.  Patient denies any current issues with urinary frequency, urgency or dysuria.  Patient states that her main complaint is generalized abdominal pain, nausea and acid reflux.  Patient states that she did see a GI doctor last year and was told that she did not have an ulcer.  Past Medical History:  Diagnosis Date  . Colitis   . Gastritis   . GERD (gastroesophageal reflux disease)   . Pyelonephritis   . Pyelonephritis     Past Surgical History:  Procedure Laterality Date  . NO PAST SURGERIES     Family History  Problem Relation Age of Onset  . Diabetes Maternal Grandmother    Social History   Tobacco Use  . Smoking status: Never Smoker  . Smokeless tobacco: Never Used  Substance Use Topics  . Alcohol use: No    Alcohol/week: 0.0 standard drinks  . Drug use: No  No Known Allergies   Review of Systems  Constitutional: Positive for fatigue. Negative for chills and fever.  HENT:  Negative for sore throat and trouble swallowing.   Respiratory: Negative for cough and shortness of breath.   Cardiovascular: Negative for chest pain, palpitations and leg swelling.  Gastrointestinal: Positive for abdominal pain and nausea. Negative for blood in stool, constipation, diarrhea and vomiting.  Genitourinary: Negative for dysuria, flank pain and frequency.  Musculoskeletal: Negative for back pain and gait problem.  Neurological: Negative for dizziness and headaches.       Objective:   Physical Exam BP 135/74   Pulse 77   Temp 98.3 F (36.8 C) (Oral)   Resp 18   Ht 5\' 1"  (1.549 m)   Wt 118 lb 3.2 oz (53.6 kg)   SpO2 100%   BMI 22.33 kg/m Vital signs and nurse's notes reviewed General-well-nourished, well-developed but small framed female in no acute distress Neck-supple, no lymphadenopathy, no thyromegaly Lungs-clear to auscultation bilaterally Cardiovascular-regular rate and rhythm Back-no CVA tenderness Abdomen-soft, patient with epigastric and right upper quadrant tenderness to palpation.  No rebound or guarding.  Patient also with complaint of suprapubic discomfort to palpation     Assessment & Plan:  1. Right upper quadrant pain Patient will be scheduled for abdominal ultrasound and follow-up of her right upper quadrant pain and epigastric pain.  Patient could have gallstones as a contributing factor to her nausea.  Patient will have CMP to look for elevations in liver enzymes such as  total bilirubin - Comprehensive metabolic panel  2. Nausea Patient will have CMP done in follow-up of her complaint of nausea.  Patient is being restarted on the prescription omeprazole 20 mg twice daily but will also have an abdominal ultrasound to look for possible gallstones - Comprehensive metabolic panel  3. Epigastric pain Patient with complaint of continued epigastric discomfort as well as acid reflux symptoms and nausea.  Patient will have CBC in follow-up of her epigastric  pain but on review of chart, patient did have a EGD done on 07/04/2017 which was normal - CBC with Differential  4. Gastroesophageal reflux disease, esophagitis presence not specified Patient will have CBC and patient will be restarted on prescription omeprazole 20 mg twice daily.  Patient will be scheduled for follow-up in approximately 4 weeks and by that time, her abdominal ultrasound results should also be available - CBC with Differential - omeprazole (PRILOSEC) 20 MG capsule; Take 1 capsule (20 mg total) by mouth 2 (two) times daily before a meal.  Dispense: 60 capsule; Refill: 3  5. Suprapubic discomfort Patient has a history of pyelonephritis and patient with some suprapubic discomfort on exam.  Will check urinalysis to look for urinary tract infection.  *Influenza immunization offered at today's visit but declined by the patient  An After Visit Summary was printed and given to the patient.  Return in about 4 weeks (around 11/05/2018).   - POCT URINALYSIS DIP (CLINITEK)

## 2018-10-08 NOTE — Progress Notes (Signed)
Flu: no  Pain:  0

## 2018-10-09 LAB — CBC WITH DIFFERENTIAL/PLATELET
Basophils Absolute: 0 x10E3/uL (ref 0.0–0.2)
Basos: 1 %
EOS (ABSOLUTE): 0.1 x10E3/uL (ref 0.0–0.4)
Eos: 2 %
Hematocrit: 38 % (ref 34.0–46.6)
Hemoglobin: 13.1 g/dL (ref 11.1–15.9)
Immature Grans (Abs): 0 x10E3/uL (ref 0.0–0.1)
Immature Granulocytes: 0 %
Lymphocytes Absolute: 1.9 x10E3/uL (ref 0.7–3.1)
Lymphs: 29 %
MCH: 31 pg (ref 26.6–33.0)
MCHC: 34.5 g/dL (ref 31.5–35.7)
MCV: 90 fL (ref 79–97)
Monocytes Absolute: 0.6 x10E3/uL (ref 0.1–0.9)
Monocytes: 9 %
Neutrophils Absolute: 4 x10E3/uL (ref 1.4–7.0)
Neutrophils: 59 %
Platelets: 267 x10E3/uL (ref 150–450)
RBC: 4.22 x10E6/uL (ref 3.77–5.28)
RDW: 11.7 % — ABNORMAL LOW (ref 12.3–15.4)
WBC: 6.7 x10E3/uL (ref 3.4–10.8)

## 2018-10-09 LAB — COMPREHENSIVE METABOLIC PANEL WITH GFR
ALT: 13 IU/L (ref 0–32)
AST: 15 IU/L (ref 0–40)
Albumin/Globulin Ratio: 1.8 (ref 1.2–2.2)
Albumin: 4.7 g/dL (ref 3.5–5.5)
Alkaline Phosphatase: 70 IU/L (ref 39–117)
BUN/Creatinine Ratio: 15 (ref 9–23)
BUN: 9 mg/dL (ref 6–20)
Bilirubin Total: 0.3 mg/dL (ref 0.0–1.2)
CO2: 19 mmol/L — ABNORMAL LOW (ref 20–29)
Calcium: 9.5 mg/dL (ref 8.7–10.2)
Chloride: 104 mmol/L (ref 96–106)
Creatinine, Ser: 0.62 mg/dL (ref 0.57–1.00)
GFR calc Af Amer: 140 mL/min/1.73
GFR calc non Af Amer: 121 mL/min/1.73
Globulin, Total: 2.6 g/dL (ref 1.5–4.5)
Glucose: 92 mg/dL (ref 65–99)
Potassium: 3.6 mmol/L (ref 3.5–5.2)
Sodium: 142 mmol/L (ref 134–144)
Total Protein: 7.3 g/dL (ref 6.0–8.5)

## 2018-10-10 LAB — URINE CULTURE

## 2018-10-12 ENCOUNTER — Ambulatory Visit: Payer: Self-pay | Attending: Internal Medicine

## 2018-10-12 ENCOUNTER — Ambulatory Visit (HOSPITAL_COMMUNITY)
Admission: RE | Admit: 2018-10-12 | Discharge: 2018-10-12 | Disposition: A | Discharge status: Home / Self Care | Payer: Self-pay | Source: Ambulatory Visit | Attending: Family Medicine | Admitting: Family Medicine

## 2018-10-12 DIAGNOSIS — K828 Other specified diseases of gallbladder: Secondary | ICD-10-CM | POA: Insufficient documentation

## 2018-10-12 DIAGNOSIS — R1011 Right upper quadrant pain: Secondary | ICD-10-CM | POA: Insufficient documentation

## 2018-10-12 DIAGNOSIS — R1013 Epigastric pain: Secondary | ICD-10-CM | POA: Insufficient documentation

## 2018-10-12 DIAGNOSIS — R110 Nausea: Secondary | ICD-10-CM | POA: Insufficient documentation

## 2018-10-12 DIAGNOSIS — N1330 Unspecified hydronephrosis: Secondary | ICD-10-CM | POA: Insufficient documentation

## 2018-10-15 ENCOUNTER — Telehealth: Payer: Self-pay | Admitting: *Deleted

## 2018-10-15 NOTE — Telephone Encounter (Signed)
-----   Message from Antony Blackbird, MD sent at 10/10/2018  5:29 PM EST ----- Please notify patient that her urine culture showed mixed urogenital flora which does not require antibiotic treatment. CBC and CMP were normal.

## 2018-10-15 NOTE — Telephone Encounter (Signed)
Medical Assistant used [employer] to contact patient.  Interpreter Name: Arrie Senate #: [id number] Patient was not available, [employer] left patient a voicemail. Voicemail states to give a call back to [place] with [facility] at [phone]. Please inform patient of urine showing no infection and all labs being normal

## 2018-10-29 ENCOUNTER — Ambulatory Visit: Payer: Self-pay | Attending: Internal Medicine | Admitting: *Deleted

## 2018-10-29 DIAGNOSIS — Z3042 Encounter for surveillance of injectable contraceptive: Secondary | ICD-10-CM

## 2018-10-29 MED ORDER — MEDROXYPROGESTERONE ACETATE 150 MG/ML IM SUSP
150.0000 mg | Freq: Once | INTRAMUSCULAR | Status: AC
  Administered 2018-10-29: 150 mg via INTRAMUSCULAR

## 2018-10-29 NOTE — Progress Notes (Signed)
Date last pap: 04/16/2018. Last Depo-Provera: 08/13/2018 Side Effects if any: none. Serum HCG indicated? no. Depo-Provera 150 mg IM given by T.Reyah Streeter,RN in right ventrogluteal. Pt tolerated well. Next appointment due February 10- February 24.

## 2018-11-13 ENCOUNTER — Ambulatory Visit: Payer: Self-pay | Admitting: Internal Medicine

## 2018-12-03 ENCOUNTER — Ambulatory Visit: Payer: Self-pay | Attending: Internal Medicine | Admitting: Internal Medicine

## 2018-12-03 ENCOUNTER — Encounter: Payer: Self-pay | Admitting: Internal Medicine

## 2018-12-03 VITALS — BP 111/72 | HR 69 | Temp 98.4°F | Resp 16 | Wt 118.2 lb

## 2018-12-03 DIAGNOSIS — R300 Dysuria: Secondary | ICD-10-CM | POA: Insufficient documentation

## 2018-12-03 DIAGNOSIS — N1330 Unspecified hydronephrosis: Secondary | ICD-10-CM | POA: Insufficient documentation

## 2018-12-03 DIAGNOSIS — K219 Gastro-esophageal reflux disease without esophagitis: Secondary | ICD-10-CM | POA: Insufficient documentation

## 2018-12-03 DIAGNOSIS — Z833 Family history of diabetes mellitus: Secondary | ICD-10-CM | POA: Insufficient documentation

## 2018-12-03 DIAGNOSIS — R1012 Left upper quadrant pain: Secondary | ICD-10-CM | POA: Insufficient documentation

## 2018-12-03 DIAGNOSIS — Z79899 Other long term (current) drug therapy: Secondary | ICD-10-CM | POA: Insufficient documentation

## 2018-12-03 MED ORDER — PROMETHAZINE HCL 12.5 MG PO TABS: 12.5000 mg | ORAL_TABLET | Freq: Every day | ORAL | 0 refills | Status: DC | PRN

## 2018-12-03 NOTE — Progress Notes (Signed)
Patient ID: Lisa Lin, female    DOB: 1986/03/29  MRN: [id number]  CC: Follow-up (3 week)   Subjective: Lisa Lin is a 32 y.o. female who presents for 3 wk f/u on generalized abdominal pain, nausea and GERD. Her concerns today include:    Patient seen by Dr. Chapman Fitch on last visit on which she complained of continued GERD symptoms, right upper quadrant pain and nausea.  Pt restarted on Omeprazole.  Gallbladder ultrasound revealed sludge in gallbladder.  Incidental finding of mild right hydronephrosis.  Patient has history of right-sided pyelonephritis.  Pt reports she is doing okay on the Omeprazole.  No further burning in stomach or pain with meals.  However, she reports LT upper abdominal pain x 1 mth.  Some nausea at times in mornings.  Moving bowels okay.    In regards to the mild hydronephrosis, pt endorses chronic dysuria x 1 yr.  No hematuria.   Some chronic BL lower back pain.  Point-of-care UA dipstick done on recent visit revealed trace blood.  No hx of kidney stones.  CT of abdomen done last yr did not reveal any hydronephrosis Patient Active Problem List   Diagnosis Date Noted  . GERD (gastroesophageal reflux disease) 04/06/2017     Current Outpatient Medications on File Prior to Visit  Medication Sig Dispense Refill  . ibuprofen (ADVIL,MOTRIN) 600 MG tablet Take 1 tablet (600 mg total) by mouth every 8 (eight) hours as needed. (Patient not taking: Reported on 12/03/2018) 30 tablet 0  . omeprazole (PRILOSEC) 20 MG capsule Take 1 capsule (20 mg total) by mouth 2 (two) times daily before a meal. 60 capsule 3   No current facility-administered medications on file prior to visit.     No Known Allergies  Social History   Socioeconomic History  . Marital status: Legally Separated    Spouse name: Not on file  . Number of children: 3  . Years of education: Not on file  . Highest education level: Not on file  Occupational History  . Occupation: Education administrator    Social Needs  . Financial resource strain: Not on file  . Food insecurity:    Worry: Not on file    Inability: Not on file  . Transportation needs:    Medical: Not on file    Non-medical: Not on file  Tobacco Use  . Smoking status: Never Smoker  . Smokeless tobacco: Never Used  Substance and Sexual Activity  . Alcohol use: No    Alcohol/week: 0.0 standard drinks  . Drug use: No  . Sexual activity: Yes    Birth control/protection: Injection  Lifestyle  . Physical activity:    Days per week: Not on file    Minutes per session: Not on file  . Stress: Not on file  Relationships  . Social connections:    Talks on phone: Not on file    Gets together: Not on file    Attends religious service: Not on file    Active member of club or organization: Not on file    Attends meetings of clubs or organizations: Not on file    Relationship status: Not on file  . Intimate partner violence:    Fear of current or ex partner: Not on file    Emotionally abused: Not on file    Physically abused: Not on file    Forced sexual activity: Not on file  Other Topics Concern  . Not on file  Social History Narrative  .  Not on file    Family History  Problem Relation Age of Onset  . Diabetes Maternal Grandmother     Past Surgical History:  Procedure Laterality Date  . NO PAST SURGERIES      ROS: Review of Systems Neg except as above PHYSICAL EXAM: BP 111/72   Pulse 69   Temp 98.4 F (36.9 C) (Oral)   Resp 16   Wt 118 lb 3.2 oz (53.6 kg)   SpO2 98%   BMI 22.33 kg/m   LNMP 15 days ago (pt on Depo) Wt Readings from Last 3 Encounters:  12/03/18 118 lb 3.2 oz (53.6 kg)  10/08/18 118 lb 3.2 oz (53.6 kg)  04/16/18 115 lb 6.4 oz (52.3 kg)    Physical Exam  General appearance - alert, well appearing, and in no distress Mental status - normal mood, behavior, speech, dress, motor activity, and thought processes Abdomen - soft, nontender, nondistended, no masses or  organomegaly Musculoskeletal -no point tenderness on palpation of the lumbar spine or surrounding paraspinal muscles.  ASSESSMENT AND PLAN: 1. Left upper quadrant pain Exam benign Since she no longer has RUQ pain or pain with meals, will not pursue further studies on gall bladder - promethazine (PHENERGAN) 12.5 MG tablet; Take 1 tablet (12.5 mg total) by mouth daily as needed for nausea or vomiting.  Dispense: 15 tablet; Refill: 0  2. Gastroesophageal reflux disease without esophagitis Continue Omeprazole  3. Hydronephrosis of right kidney ? Significance of this.  Will refer to urology for further eval - Ambulatory referral to Urology    Patient was given the opportunity to ask questions.  Patient verbalized understanding of the plan and was able to repeat key elements of the plan.  [employer] interpreter used during this encounter. #[id number]  Orders Placed This Encounter  Procedures  . Ambulatory referral to Urology     Requested Prescriptions   Signed Prescriptions Disp Refills  . promethazine (PHENERGAN) 12.5 MG tablet 15 tablet 0    Sig: Take 1 tablet (12.5 mg total) by mouth daily as needed for nausea or vomiting.    Return if symptoms worsen or fail to improve.  Karle Plumber, MD, FACP

## 2019-01-04 ENCOUNTER — Other Ambulatory Visit: Payer: Self-pay | Admitting: Urology

## 2019-01-04 DIAGNOSIS — N130 Hydronephrosis with ureteropelvic junction obstruction: Secondary | ICD-10-CM

## 2019-01-10 ENCOUNTER — Encounter (HOSPITAL_COMMUNITY)
Admission: RE | Admit: 2019-01-10 | Discharge: 2019-01-10 | Disposition: A | Discharge status: Home / Self Care | Payer: Self-pay | Source: Ambulatory Visit | Attending: Urology | Admitting: Urology

## 2019-01-10 ENCOUNTER — Encounter (HOSPITAL_COMMUNITY): Payer: Self-pay

## 2019-01-10 DIAGNOSIS — N130 Hydronephrosis with ureteropelvic junction obstruction: Secondary | ICD-10-CM | POA: Insufficient documentation

## 2019-01-10 MED ORDER — TECHNETIUM TC 99M MERTIATIDE
4.9700 | Freq: Once | INTRAVENOUS | Status: AC | PRN
  Administered 2019-01-10: 4.97 via INTRAVENOUS

## 2019-01-10 MED ORDER — FUROSEMIDE 10 MG/ML IJ SOLN: 27.0000 mg | Freq: Once | INTRAMUSCULAR | Status: DC

## 2019-01-10 MED ORDER — FUROSEMIDE 10 MG/ML IJ SOLN
INTRAMUSCULAR | Status: AC
  Filled 2019-01-10: qty 4

## 2019-02-01 ENCOUNTER — Ambulatory Visit: Payer: Self-pay | Attending: Family Medicine | Admitting: *Deleted

## 2019-02-01 DIAGNOSIS — Z3042 Encounter for surveillance of injectable contraceptive: Secondary | ICD-10-CM

## 2019-02-01 LAB — POCT URINE PREGNANCY: Preg Test, Ur: NEGATIVE

## 2019-02-01 MED ORDER — MEDROXYPROGESTERONE ACETATE 150 MG/ML IM SUSP
150.0000 mg | Freq: Once | INTRAMUSCULAR | Status: AC
  Administered 2019-02-01: 150 mg via INTRAMUSCULAR

## 2019-02-01 NOTE — Progress Notes (Signed)
Date last pap: 04/16/2018. Last Depo-Provera: 10/29/2018 Side Effects if any: none. Serum HCG indicated? no. Depo-Provera 150 mg IM given by T.Ayah Cozzolino,RN in left ventrogluteal. Pt tolerated well. Next appointment due- May 16-May 30

## 2019-03-06 ENCOUNTER — Encounter: Payer: Self-pay | Admitting: Internal Medicine

## 2019-03-06 NOTE — Progress Notes (Signed)
Saw urology 02/20/2019.  Had renogram that was nl.  No further f/u needed

## 2019-05-02 ENCOUNTER — Ambulatory Visit: Payer: Self-pay

## 2019-05-03 ENCOUNTER — Other Ambulatory Visit: Payer: Self-pay

## 2019-05-03 ENCOUNTER — Ambulatory Visit: Payer: Self-pay | Attending: Internal Medicine | Admitting: *Deleted

## 2019-05-03 DIAGNOSIS — Z3042 Encounter for surveillance of injectable contraceptive: Secondary | ICD-10-CM

## 2019-05-03 MED ORDER — MEDROXYPROGESTERONE ACETATE 150 MG/ML IM SUSP
150.0000 mg | Freq: Once | INTRAMUSCULAR | Status: AC
  Administered 2019-05-03: 150 mg via INTRAMUSCULAR

## 2019-05-03 NOTE — Progress Notes (Signed)
Date last pap:04/16/2018. Last Depo-Provera:02/01/2019 Side Effects if OMQ:TTCN. Serum HCG indicated?no. Depo-Provera 150 mg IM given byT.Zalayah Pizzuto,RN in left ventrogluteal. Pt tolerated well. Next appointment due-August 14-August 28

## 2019-07-08 ENCOUNTER — Ambulatory Visit: Payer: Self-pay

## 2019-07-15 ENCOUNTER — Ambulatory Visit: Payer: Self-pay | Attending: Internal Medicine

## 2019-07-15 ENCOUNTER — Other Ambulatory Visit: Payer: Self-pay

## 2019-07-19 ENCOUNTER — Other Ambulatory Visit: Payer: Self-pay

## 2019-07-19 ENCOUNTER — Ambulatory Visit: Payer: Self-pay | Attending: Internal Medicine | Admitting: *Deleted

## 2019-07-19 DIAGNOSIS — Z3042 Encounter for surveillance of injectable contraceptive: Secondary | ICD-10-CM

## 2019-07-19 MED ORDER — MEDROXYPROGESTERONE ACETATE 150 MG/ML IM SUSY
150.0000 mg | PREFILLED_SYRINGE | Freq: Once | INTRAMUSCULAR | Status: AC
  Administered 2019-07-19: 10:00:00 150 mg via INTRAMUSCULAR

## 2019-07-19 NOTE — Progress Notes (Signed)
Last Depo-Provera:05-03-2019 Side Effects if KCC:QFJU. Serum HCG indicated?no. Depo-Provera 150 mg IM  given byO. Beauden Tremont, RMA  Given in: Right Outer Upper Quad.  Pt tolerated well. Next appointment due: October 30-October 18 2019

## 2019-10-04 ENCOUNTER — Ambulatory Visit: Payer: Self-pay | Attending: Family Medicine

## 2019-10-04 ENCOUNTER — Other Ambulatory Visit: Payer: Self-pay

## 2019-10-04 DIAGNOSIS — Z3042 Encounter for surveillance of injectable contraceptive: Secondary | ICD-10-CM

## 2019-10-04 MED ORDER — MEDROXYPROGESTERONE ACETATE 150 MG/ML IM SUSP
150.0000 mg | Freq: Once | INTRAMUSCULAR | Status: AC
  Administered 2019-10-04: 10:00:00 150 mg via INTRAMUSCULAR

## 2019-10-04 NOTE — Progress Notes (Signed)
Last depo shot: 07/19/2019 Urine pregnancy not needed. Shot was given in left ventrogluteal and tolerated well. Next depo shot due: Jan 15- Jan 29.

## 2019-10-25 IMAGING — NM NM RENAL IMAGING FLOW W/ PHARM
2 series · 12 of 12 positions shown · non-contrast
Comparison: Renal ultrasound 10/12/2018

CLINICAL DATA: Acquired hydronephrosis of LEFT kidney with UPJ
obstruction

EXAM:
NUCLEAR MEDICINE RENAL SCAN WITH DIURETIC ADMINISTRATION
TECHNIQUE: Radionuclide angiographic and sequential renal images were obtained
after intravenous injection of radiopharmaceutical. Imaging was
continued during slow intravenous injection of Lasix approximately
15 minutes after the start of the examination.
RADIOPHARMACEUTICALS:  4.97 mCi [id number]-ZZm MAG3 IV
Pharmaceutical: Lasix 27 mg IV at 20 minutes into renography

[Series 1: renal scan · 4.14mm/px · 6 of 40 frames shown (1 of 2)]
[frame 4/40  full-range]
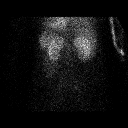
[frame 10/40  full-range]
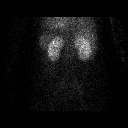
[frame 17/40  full-range]
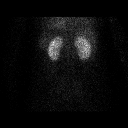
[frame 24/40  full-range]
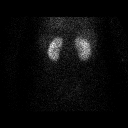
[frame 30/40  full-range]
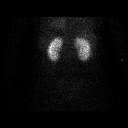
[frame 37/40  full-range]
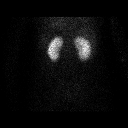

[Series 1: renal scan · 4.14mm/px · 6 of 57 frames shown (2 of 2)]
[frame 5/57]
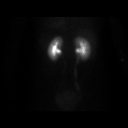
[frame 15/57]
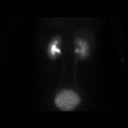
[frame 24/57]
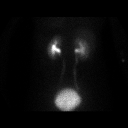
[frame 34/57]
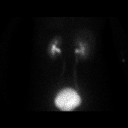
[frame 43/57]
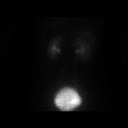
[frame 53/57]
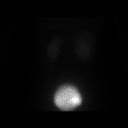

[12 of 12 positions shown; findings below may reference images not displayed]

FINDINGS: Flow:  Prompt symmetric arterial flow to the kidneys.

Left renogram: Normal uptake, concentration, and excretion of tracer
by LEFT kidney. Good washout of tracer from a minimally prominent
LEFT renal collecting system both before and following diuretic
administration. No significant retained tracer at the conclusion of
the exam. Analysis of the renogram curve demonstrates a normal time
to peak activity of 4.8 minutes with fall to half maximum activity
6.5 minutes later.

Right renogram: Normal uptake, concentration, and excretion of
tracer by RIGHT kidney. Good washout of tracer from the RIGHT renal
collecting system both before and following Lasix administration. No
significant retained tracer at the conclusion of the exam. Analysis
of the renogram curves demonstrates a normal time to peak activity
of 4.1 minutes with fall to half maximum activity 3.2 minutes later.

Differential:

Left kidney = 49 %

Right kidney = 51 %

T1/2 post Lasix :

Left kidney = 2.3 min

Right kidney = 2.6 min
IMPRESSION: Minimal asymmetric prominence of LEFT renal collecting system versus
RIGHT kidney.

Otherwise normal exam with no evidence of impaired renal function or
urinary outflow obstruction by either kidney.

## 2019-12-23 ENCOUNTER — Ambulatory Visit: Payer: Self-pay | Attending: Internal Medicine | Admitting: *Deleted

## 2019-12-23 ENCOUNTER — Other Ambulatory Visit: Payer: Self-pay

## 2019-12-23 DIAGNOSIS — Z3042 Encounter for surveillance of injectable contraceptive: Secondary | ICD-10-CM

## 2019-12-23 MED ORDER — MEDROXYPROGESTERONE ACETATE 150 MG/ML IM SUSP
150.0000 mg | Freq: Once | INTRAMUSCULAR | Status: AC
  Administered 2019-12-23: 09:00:00 150 mg via INTRAMUSCULAR

## 2019-12-23 NOTE — Progress Notes (Signed)
Date last pap: 04/16/2018. Last Depo-Provera: October 30,2020. Side Effects if any: n/a. Urine HCG indicated? n/a Depo-Provera 150 mg IM given by T. Marland Kitchen, RN. Administered in left deltoid. Patient tolerated well.  Next appointment due April 5 through April 19.

## 2019-12-27 ENCOUNTER — Ambulatory Visit: Payer: Self-pay | Attending: Family Medicine | Admitting: Family Medicine

## 2019-12-27 ENCOUNTER — Other Ambulatory Visit: Payer: Self-pay

## 2019-12-27 ENCOUNTER — Encounter: Payer: Self-pay | Admitting: Family Medicine

## 2019-12-27 VITALS — BP 114/76 | HR 74 | Ht 61.0 in | Wt 118.0 lb

## 2019-12-27 DIAGNOSIS — K219 Gastro-esophageal reflux disease without esophagitis: Secondary | ICD-10-CM

## 2019-12-27 DIAGNOSIS — R1013 Epigastric pain: Secondary | ICD-10-CM

## 2019-12-27 DIAGNOSIS — G5601 Carpal tunnel syndrome, right upper limb: Secondary | ICD-10-CM

## 2019-12-27 DIAGNOSIS — M25511 Pain in right shoulder: Secondary | ICD-10-CM

## 2019-12-27 DIAGNOSIS — M7711 Lateral epicondylitis, right elbow: Secondary | ICD-10-CM

## 2019-12-27 DIAGNOSIS — M7701 Medial epicondylitis, right elbow: Secondary | ICD-10-CM

## 2019-12-27 MED ORDER — PROMETHAZINE HCL 12.5 MG PO TABS: 12.5000 mg | ORAL_TABLET | Freq: Three times a day (TID) | ORAL | 0 refills | Status: DC | PRN

## 2019-12-27 MED ORDER — OMEPRAZOLE 20 MG PO CPDR: 20.0000 mg | DELAYED_RELEASE_CAPSULE | Freq: Two times a day (BID) | ORAL | 3 refills | Status: DC

## 2019-12-27 MED ORDER — IBUPROFEN 600 MG PO TABS: 600.0000 mg | ORAL_TABLET | Freq: Three times a day (TID) | ORAL | 0 refills | Status: DC | PRN

## 2019-12-27 MED ORDER — CYCLOBENZAPRINE HCL 5 MG PO TABS: 5.0000 mg | ORAL_TABLET | Freq: Three times a day (TID) | ORAL | 0 refills | Status: DC | PRN

## 2019-12-27 MED ORDER — PREDNISONE 20 MG PO TABS: ORAL_TABLET | ORAL | 0 refills | Status: DC

## 2019-12-27 NOTE — Progress Notes (Signed)
Pain in right shoulder. 

## 2019-12-27 NOTE — Patient Instructions (Signed)
Sndrome del tnel carpiano Carpal Tunnel Syndrome  El sndrome del tnel carpiano es una afeccin que causa dolor en la mano y en el brazo. El tnel carpiano es un rea estrecha que se encuentra en el lado palmar de la mueca. Los movimientos repetidos de la mueca o determinadas enfermedades pueden causar hinchazn en el tnel. Esta hinchazn puede comprimir el nervio principal de la mueca (nervio mediano). Cules son las causas? Esta afeccin puede ser causada por lo siguiente:  Movimientos repetidos de la mueca.  Lesiones en la mueca.  Artritis.  Un saco lleno de lquido (quiste) o un crecimiento anormal (tumor) en el tnel carpiano.  Acumulacin de lquido durante el embarazo. Algunas veces, la causa no se conoce. Qu incrementa el riesgo? Los siguientes factores pueden hacer que sea ms propenso a contraer esta afeccin:  Tener un trabajo que exige mover la mueca del mismo modo muchas veces. Esto incluye trabajos como el de carnicero o el de cajero.  Ser mujer.  Tener otras afecciones, por ejemplo: ? Diabetes. ? Obesidad. ? Una glndula tiroidea que no es lo suficientemente activa (hipotiroidismo). ? Insuficiencia renal. Cules son los signos o sntomas? Los sntomas de esta afeccin incluyen:  Sensacin de hormigueo en los dedos.  Hormigueo o prdida de la sensibilidad (adormecimiento) en la mano.  Dolor en todo el brazo. Este dolor puede empeorar al flexionar la mueca y el codo durante mucho tiempo.  Dolor en la mueca que sube por el brazo hasta el hombro.  Dolor que baja hasta la palma de la mano o los dedos.  Sensacin de debilidad en las manos. Puede resultarle difcil tomar y sostener objetos. Es posible que se sienta peor por la noche. Cmo se diagnostica? Esta afeccin se diagnostica mediante los antecedentes mdicos y un examen fsico. Tambin pueden hacerle estudios, por ejemplo:  Una electromiograma (EMG). Este estudio comprueba las seales  que los nervios envan a los msculos.  Estudio de conduccin nerviosa. Este estudio comprueba si las seales pasan correctamente por los nervios.  Estudios de diagnstico por imgenes, como radiografas, una ecografa y una resonancia magntica (RM). Estos estudios permiten detectar cul podra ser la causa de su afeccin. Cmo se trata? El tratamiento de esta afeccin puede incluir:  Cambios en el estilo de vida. Se le pedir que deje o cambie la actividad que caus el problema.  Hacer ejercicio y actividades para que los msculos y los huesos se vuelvan ms fuertes (fisioterapia).  Aprender a usar la mano nuevamente (terapia ocupacional).  Medicamentos para tratar el dolor y la hinchazn (inflamacin). Es posible que le apliquen inyecciones en la mueca.  Una frula para la mueca.  Una ciruga. Siga estas indicaciones en su casa: Si tiene una frula:  Use la frula como se lo haya indicado el mdico. Quteselo solamente como se lo haya indicado el mdico.  Afloje la frula si los dedos: ? Hormiguean. ? Pierden la sensibilidad (se adormecen). ? Se tornan fros y de color azul.  Mantenga la frula limpia.  Si la frula no es impermeable: ? No deje que se moje. ? Cbralo con un envoltorio hermtico cuando tome un bao de inmersin o una ducha. Control del dolor, la rigidez y la hinchazn   Si se lo indican, aplique hielo sobre la zona dolorida: ? Si tiene una frula desmontable, qutesela como se lo haya indicado el mdico. ? Ponga el hielo en una bolsa plstica. ? Coloque una toalla entre la piel y la bolsa. ? Coloque el hielo   durante 73minutos, 2a3veces al [employer]. Indicaciones generales  [employer] de venta libre y los recetados solamente como se lo haya indicado el mdico.  Descanse la [place] de cualquier actividad que le cause dolor. Si es necesario, hable con su jefe en el [employer] cambios que pueden ayudar a la curacin de la [place].  Haga  los ejercicios como se lo hayan indicado el mdico, el fisioterapeuta o el terapeuta ocupacional.  Concurra a todas las visitas de seguimiento como se lo haya indicado el mdico. Esto es importante. Comunquese con un mdico si:  Aparecen nuevos sntomas.  Los medicamentos no Forensic psychologist.  Sus sntomas empeoran. Solicite ayuda de inmediato si:  Tiene adormecimiento u hormigueo muy intensos en la mueca o la [place]. Resumen  El sndrome del tnel carpiano es una afeccin que causa dolor en la mano y en el brazo.  Suele deberse a movimientos repetidos de Advertising copywriter.  Este problema se trata mediante cambios en el estilo de vida y medicamentos. La ciruga puede ser necesaria en casos muy graves.  Siga las indicaciones del mdico sobre el uso de una frula, el reposo de la [place], la asistencia a las consultas de control y Solicitor para pedir ayuda. Esta informacin no tiene Marine scientist el consejo del mdico. Asegrese de hacerle al mdico cualquier pregunta que tenga. Document Revised: 04/26/2018 Document Reviewed: 04/26/2018 Elsevier Patient Education  2020 [employer].

## 2019-12-27 NOTE — Progress Notes (Signed)
Established Patient Office Visit  Subjective:  Patient ID: Orlando Ansara, female    DOB: 1986-10-08  Age: 34 y.o. MRN: [id number]  CC: shoulder pain, right  Due to language barrier, Stratus video interpretation system was used at today's visit  HPI [facility] [name] presents due to the complaint of 1 week of right shoulder, elbow and wrist pain with numbness and tingling in the right hand/fingers. She is right handed and works in housekeeping. No injury or specific incident that started her pain. Pain in sharp and burning and was about a 8-9 on a 0-10 scale. Has slightly gotten better but still very painful. She has not taken any otc pain medication. She has a history of epigastric pain and was on reflux medication in the past but states that she does not have refills.  No current nausea/vomiting/diarrhea or constipation.  No blood in the stool or black stools.  Past Medical History:  Diagnosis Date  . Colitis   . Gastritis   . GERD (gastroesophageal reflux disease)   . Pyelonephritis   . Pyelonephritis     Past Surgical History:  Procedure Laterality Date  . NO PAST SURGERIES      Family History  Problem Relation Age of Onset  . Diabetes Maternal Grandmother     Social History   Socioeconomic History  . Marital status: Legally Separated    Spouse name: Not on file  . Number of children: 3  . Years of education: Not on file  . Highest education level: Not on file  Occupational History  . Occupation: cleaning  Tobacco Use  . Smoking status: Never Smoker  . Smokeless tobacco: Never Used  Substance and Sexual Activity  . Alcohol use: No    Alcohol/week: 0.0 standard drinks  . Drug use: No  . Sexual activity: Yes    Birth control/protection: Injection  Other Topics Concern  . Not on file  Social History Narrative  . Not on file   Social Determinants of Health   Financial Resource Strain:   . Difficulty of Paying Living Expenses: Not on  file  Food Insecurity:   . Worried About Charity fundraiser in the Last Year: Not on file  . Ran Out of Food in the Last Year: Not on file  Transportation Needs:   . Lack of Transportation (Medical): Not on file  . Lack of Transportation (Non-Medical): Not on file  Physical Activity:   . Days of Exercise per Week: Not on file  . Minutes of Exercise per Session: Not on file  Stress:   . Feeling of Stress : Not on file  Social Connections:   . Frequency of Communication with Friends and Family: Not on file  . Frequency of Social Gatherings with Friends and Family: Not on file  . Attends Religious Services: Not on file  . Active Member of Clubs or Organizations: Not on file  . Attends Archivist Meetings: Not on file  . Marital Status: Not on file  Intimate Partner Violence:   . Fear of Current or Ex-Partner: Not on file  . Emotionally Abused: Not on file  . Physically Abused: Not on file  . Sexually Abused: Not on file    Outpatient Medications Prior to Visit  Medication Sig Dispense Refill  . ibuprofen (ADVIL,MOTRIN) 600 MG tablet Take 1 tablet (600 mg total) by mouth every 8 (eight) hours as needed. (Patient not taking: Reported on 12/03/2018) 30 tablet 0  .  omeprazole (PRILOSEC) 20 MG capsule Take 1 capsule (20 mg total) by mouth 2 (two) times daily before a meal. 60 capsule 3  . promethazine (PHENERGAN) 12.5 MG tablet Take 1 tablet (12.5 mg total) by mouth daily as needed for nausea or vomiting. 15 tablet 0   No facility-administered medications prior to visit.    No Known Allergies  ROS Review of Systems  Constitutional: Positive for fatigue. Negative for chills and fever.  HENT: Negative for sore throat and trouble swallowing.   Eyes: Negative for photophobia and visual disturbance.  Respiratory: Negative for cough and shortness of breath.   Cardiovascular: Negative for chest pain, palpitations and leg swelling.  Gastrointestinal: Positive for abdominal  pain. Negative for constipation, diarrhea and nausea.  Endocrine: Negative for polydipsia, polyphagia and polyuria.  Genitourinary: Negative for dysuria and frequency.  Musculoskeletal: Positive for arthralgias and back pain.  Neurological: Negative for dizziness and headaches.  Hematological: Negative for adenopathy. Does not bruise/bleed easily.      Objective:    Physical Exam  Constitutional: She is oriented to person, place, and time. She appears well-developed and well-nourished.  Well-nourished well-developed but thin framed female in no acute distress but appears anxious.  She is wearing face mask as per office COVID-19 precautions.  Patient has right arm with palm up turned resting on her right thigh and patient is sitting somewhat stiffly  Neck: No JVD present. No thyromegaly present.  Presents of right lateral neck/upper back/trapezius spasm and tenderness to palpation  Cardiovascular: Normal rate and regular rhythm.  Pulmonary/Chest: Effort normal and breath sounds normal.  Abdominal: Soft. There is abdominal tenderness. There is no rebound and no guarding.  Mild epigastric tenderness  Musculoskeletal:        General: Edema present. No tenderness.     Comments: Patient with tenderness to palpation over the posterior lateral right shoulder and at the inner and at the lateral and medial epicondyles of the right elbow.  Pain at the epicondyles with pronation/supination of the right hand.  Patient appears to have mild puffiness/possible edema at the right elbow and right wrist.  Positive Tinel at the right wrist and limited ROM of the right arm due to pain- unable to left right arm more than a few inches due to increased pian in shoulder/lateral upper arm with arm movement. Has some spasm in the right upper back/trapezius/neck area  Lymphadenopathy:    She has no cervical adenopathy.  Neurological: She is alert and oriented to person, place, and time.  Psychiatric: Her behavior is  normal. Thought content normal.  Slightly anxious initially  Nursing note and vitals reviewed.   BP 114/76   Pulse 74   Ht 5\' 1"  (1.549 m)   Wt 118 lb (53.5 kg)   SpO2 100%   BMI 22.30 kg/m  Wt Readings from Last 3 Encounters:  12/03/18 118 lb 3.2 oz (53.6 kg)  10/08/18 118 lb 3.2 oz (53.6 kg)  04/16/18 115 lb 6.4 oz (52.3 kg)     Health Maintenance Due  Topic Date Due  . INFLUENZA VACCINE  07/06/2019    There are no preventive care reminders to display for this patient.  Lab Results  Component Value Date   TSH 1.092 12/12/2014   Lab Results  Component Value Date   WBC 6.7 10/08/2018   HGB 13.1 10/08/2018   HCT 38.0 10/08/2018   MCV 90 10/08/2018   PLT 267 10/08/2018   Lab Results  Component Value Date   NA  142 10/08/2018   K 3.6 10/08/2018   CO2 19 (L) 10/08/2018   GLUCOSE 92 10/08/2018   BUN 9 10/08/2018   CREATININE 0.62 10/08/2018   BILITOT 0.3 10/08/2018   ALKPHOS 70 10/08/2018   AST 15 10/08/2018   ALT 13 10/08/2018   PROT 7.3 10/08/2018   ALBUMIN 4.7 10/08/2018   CALCIUM 9.5 10/08/2018   ANIONGAP 8 03/30/2017   Lab Results  Component Value Date   CHOL 119 12/12/2014   Lab Results  Component Value Date   HDL 60 12/12/2014   Lab Results  Component Value Date   LDLCALC 48 12/12/2014   Lab Results  Component Value Date   TRIG 54 12/12/2014   Lab Results  Component Value Date   CHOLHDL 2.0 12/12/2014   No results found for: HGBA1C    Assessment & Plan:  1. Acute pain of right shoulder She reports acute onset of right posterior and lateral shoulder/upper arm pain that is sharp and burning in nature.  She cannot recall any injury to the area.  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  Discussed with patient that I suspect that she has rotator cuff tendinitis or rotator cuff pathology.  She has been placed on a prednisone taper and prescription provided for Flexeril for muscle spasm/tightness  and she may take Tylenol as needed for pain until she finishes the prednisone and thereafter she may take ibuprofen if she is not having any continued stomach upset/epigastric pain.  She is also being referred to orthopedics for further evaluation and treatment.  She was offered but declined work note for today. - Ambulatory referral to Orthopedic Surgery - predniSONE (DELTASONE) 20 MG tablet; Take 3 pills today then 2 pills once per day for 2 days, 1 pill daily for 2 days then 1/2 pill daily for 2 days; Take after eating  Dispense: 10 tablet; Refill: 0 - cyclobenzaprine (FLEXERIL) 5 MG tablet; Take 1 tablet (5 mg total) by mouth 3 (three) times daily as needed for muscle spasms. May take 2 at bedtime if needed for sleep  Dispense: 30 tablet; Refill: 0 - ibuprofen (ADVIL) 600 MG tablet; Take 1 tablet (600 mg total) by mouth every 8 (eight) hours as needed for moderate pain.  Dispense: 60 tablet; Refill: 0  2. Epicondylitis elbow, medial, right; epicondylitis elbow, lateral, right She has tenderness on exam at the medial and lateral epicondyles of the right elbow and mild puffiness/edema.  She also has elbow pain with pronation/supination of the right hand.  Discussed with patient that she likely has epicondylitis of the medial and lateral epicondyles of the elbow and she has been placed on prednisone taper, referred to orthopedics for further evaluation and treatment and she may take OTC pain medications such as Tylenol while on prednisone and after finishing prednisone she may take ibuprofen if needed for pain but should avoid the use of nonsteroidal anti-inflammatories if she has continued epigastric pain. - Ambulatory referral to Orthopedic Surgery - predniSONE (DELTASONE) 20 MG tablet; Take 3 pills today then 2 pills once per day for 2 days, 1 pill daily for 2 days then 1/2 pill daily for 2 days; Take after eating  Dispense: 10 tablet; Refill: 0  3. Epigastric pain She reports a history of prior use  of medication to reduce stomach acid and has omeprazole 20 mg twice daily on her medication list however she states that she has been out of this medication  and it was not refilled.  She has been having some epigastric discomfort but denies blood in the stool, black stools or current nausea.  She is being placed on prednisone at today's visit to help with shoulder, elbow and wrist pain/inflammation and she has been made aware several times through the interpreter that she needs to eat prior to taking the prednisone and that she is to restart the use of her omeprazole which was refilled at today's visit.  If she has continued or worsening abdominal pain she should not start the use of ibuprofen but should make an appointment in 1 to 2 weeks to follow-up with her PCP-primary care provider here at this office.  If she has blood in the stool or black stools she should seek immediate medical attention at the emergency department. - omeprazole (PRILOSEC) 20 MG capsule; Take 1 capsule (20 mg total) by mouth 2 (two) times daily before a meal.  Dispense: 60 capsule; Refill: 3 - promethazine (PHENERGAN) 12.5 MG tablet; Take 1 tablet (12.5 mg total) by mouth every 8 (eight) hours as needed for nausea or vomiting.  Dispense: 15 tablet; Refill: 0  4. Carpal tunnel syndrome of right wrist Symptoms suggestive of carpal tunnel syndrome based on complaint of numbness and tingling in the right hand and work history consistent with repetitive motions.  She also has positive Tinel on exam.  Educational material in Spanish on carpal tunnel syndrome provided at today's visit.  Patient has been placed on prednisone taper and referred to orthopedics for further evaluation and treatment. - Ambulatory referral to Orthopedic Surgery - predniSONE (DELTASONE) 20 MG tablet; Take 3 pills today then 2 pills once per day for 2 days, 1 pill daily for 2 days then 1/2 pill daily for 2 days; Take after eating  Dispense: 10 tablet; Refill:  0  6.  Language barrier Stratus video interpretation system used at today's visit to help with language barrier   An After Visit Summary was printed and given to the patient.  Follow-up: Return for epigastric pain-1-2 weeks if not feeling better.   Antony Blackbird, MD

## 2019-12-30 ENCOUNTER — Ambulatory Visit (INDEPENDENT_AMBULATORY_CARE_PROVIDER_SITE_OTHER): Payer: Self-pay

## 2019-12-30 ENCOUNTER — Ambulatory Visit (INDEPENDENT_AMBULATORY_CARE_PROVIDER_SITE_OTHER): Payer: Self-pay | Admitting: Physician Assistant

## 2019-12-30 ENCOUNTER — Encounter: Payer: Self-pay | Admitting: Physician Assistant

## 2019-12-30 ENCOUNTER — Other Ambulatory Visit: Payer: Self-pay

## 2019-12-30 DIAGNOSIS — M25511 Pain in right shoulder: Secondary | ICD-10-CM

## 2019-12-30 DIAGNOSIS — M542 Cervicalgia: Secondary | ICD-10-CM

## 2019-12-30 NOTE — Addendum Note (Signed)
Addended by: Michae Kava B on: 12/30/2019 02:31 PM   Modules accepted: Orders

## 2019-12-30 NOTE — Progress Notes (Signed)
Office Visit Note   Patient: Lisa Lin           Date of Birth: Oct 07, 1986           MRN: [id number] Visit Date: 12/30/2019              Requested by: Antony Blackbird, MD [place],  [place] [address] PCP: Ladell Pier, MD   Assessment & Plan: Visit Diagnoses:  1. Acute pain of right shoulder   2. Cervicalgia     Plan: We will have her finish up the prednisone Dosepak.  Continue the Flexeril.  Begin moist heat to her neck several times [er day.  Send her to formal physical therapy to work on range of motion, strengthening home exercise and modalities to the cervical spine.  Follow-up with [place] in 1 month if her pain persist at that time with recommend MRI to rule out HNP as source of the  Follow-Up Instructions: Return in about 4 weeks (around 01/27/2020).   Orders:  Orders Placed This Encounter  Procedures  . XR Shoulder Right  . XR Cervical Spine 2 or 3 views   No orders of the defined types were placed in this encounter.     Procedures: No procedures performed   Clinical Data: No additional findings.   Subjective: Chief Complaint  Patient presents with  . Right Shoulder - Pain    HPI Lisa Lin is a 34 year old female comes in today for right shoulder and neck pain.  She having pain that is radiating down the right arm.  Mostly now she is having numbness down the right arm that goes into the index and fifth finger.  Prior to starting prednisone Dosepak on 12/27/2019 and Flexeril she was having pain down the arm this is pain is dissipated.  She feels she is about 50% better but still has the down the right arm..  She is having some right shoulder pain also.  She is had no new injury to the neck or shoulder.  She notes increased pain and decreased range of motion of the right shoulder.  Neck pain and shoulder pain has been ongoing for at least last 2 weeks.  She is accompanied by an interpreter and she speaks [place].  Review of  Systems Negative for fevers chills shortness of breath.  Please see HPI.  Objective: Vital Signs: There were no vitals taken for this visit.  Physical Exam Constitutional:      Appearance: She is normal weight. She is not ill-appearing or diaphoretic.  Pulmonary:     Effort: Pulmonary effort is normal.  Neurological:     Mental Status: She is alert and oriented to person, place, and time.  Psychiatric:        Mood and Affect: Mood normal.     Ortho Exam Cervical spine positive Spurling's.  She has full range of motion but discomfort with rotation to the left and right.  Tenderness medial border of the right scapula only.  Upper extremities 5 out of 5 strength throughout.  Slight back pain with impingement testing right shoulder.  Overall fluid range of motion both shoulders.  Full motor bilateral hands.  Subjective decrease sensation right hand over the ring and fifth finger. Specialty Comments:  No specialty comments available.  Imaging: XR Cervical Spine 2 or 3 views  Result Date: 12/30/2019 Cervical spine AP lateral views: Disc space well-maintained.  No arthritic changes noted  No spondylolisthesis.  Loss of normal lordotic curvature.  No acute fractures.  XR Shoulder Right  Result Date: 12/30/2019 Right shoulder 3 views: Shoulders well located.  No bony abnormalities.  AC joint is well-maintained.  Glenohumeral joint well-maintained.  No acute fractures.    PMFS History: Patient Active Problem List   Diagnosis Date Noted  . Hydronephrosis of right kidney 12/03/2018  . GERD (gastroesophageal reflux disease) 04/06/2017   Past Medical History:  Diagnosis Date  . Colitis   . Gastritis   . GERD (gastroesophageal reflux disease)   . Pyelonephritis   . Pyelonephritis     Family History  Problem Relation Age of Onset  . Diabetes Maternal Grandmother     Past Surgical History:  Procedure Laterality Date  . NO PAST SURGERIES     Social History   Occupational  History  . Occupation: cleaning  Tobacco Use  . Smoking status: Never Smoker  . Smokeless tobacco: Never Used  Substance and Sexual Activity  . Alcohol use: No    Alcohol/week: 0.0 standard drinks  . Drug use: No  . Sexual activity: Yes    Birth control/protection: Injection

## 2020-01-08 ENCOUNTER — Other Ambulatory Visit: Payer: Self-pay

## 2020-01-08 ENCOUNTER — Ambulatory Visit: Payer: Self-pay | Attending: Internal Medicine

## 2020-01-09 ENCOUNTER — Ambulatory Visit: Payer: Self-pay | Attending: Physician Assistant | Admitting: Physical Therapy

## 2020-01-09 DIAGNOSIS — M25511 Pain in right shoulder: Secondary | ICD-10-CM | POA: Insufficient documentation

## 2020-01-09 DIAGNOSIS — M6281 Muscle weakness (generalized): Secondary | ICD-10-CM | POA: Insufficient documentation

## 2020-01-09 DIAGNOSIS — R293 Abnormal posture: Secondary | ICD-10-CM | POA: Insufficient documentation

## 2020-01-09 DIAGNOSIS — M542 Cervicalgia: Secondary | ICD-10-CM | POA: Insufficient documentation

## 2020-01-09 DIAGNOSIS — R252 Cramp and spasm: Secondary | ICD-10-CM | POA: Insufficient documentation

## 2020-01-09 NOTE — Therapy (Signed)
[facility] [place], [place], [address] Phone: [phone]   Fax:  [phone]  Physical Therapy Evaluation  Patient Details  Name: Lisa Lin MRN: [id number] Date of Birth: 10-Oct-1986 Referring Provider (PT): Erskine Emery MD   Encounter Date: 01/09/2020  PT End of Session - 01/09/20 1013    Visit Number  1    Number of Visits  6    Date for PT Re-Evaluation  02/20/20    Authorization Type  CAFA    PT Start Time  0848    PT Stop Time  0930    PT Time Calculation (min)  42 min    Activity Tolerance  Patient tolerated treatment well    Behavior During Therapy  [facility] for tasks assessed/performed       Past Medical History:  Diagnosis Date  . Colitis   . Gastritis   . GERD (gastroesophageal reflux disease)   . Pyelonephritis   . Pyelonephritis     Past Surgical History:  Procedure Laterality Date  . NO PAST SURGERIES      There were no vitals filed for this visit.   Subjective Assessment - 01/09/20 0855    Subjective  I have had pain in neck and RT shoulder for 3 weeks,  1st 2 weeks very painful and strong but now better , not that strong,  hard to do chores.  Now 2/10.  No accident, I was at work. I was cleaning and it happened. It came on mildly and then increased to 9/10    Patient is accompained by:  Interpreter   Spanish   Pertinent History  gastritis, pyelonephritis    Limitations  Lifting;House hold activities    How long can you sit comfortably?  2 hours    Diagnostic tests  no abnormalities    Currently in Pain?  Yes    Pain Score  2     Pain Location  Neck    Pain Orientation  Right    Pain Descriptors / Indicators  Heaviness;Sharp    Pain Type  Acute pain    Pain Radiating Towards  from neck to the shoulder    Pain Onset  1 to 4 weeks ago    Pain Frequency  Constant    Aggravating Factors   lifting arms, mopping laundry, driving and turning neck    Multiple Pain Sites   Yes    Pain Score  2    Pain Location  Shoulder    Pain Orientation  Right    Pain Descriptors / Indicators  Heaviness;Sharp    Pain Type  Acute pain         [facility] PT Assessment - 01/09/20 0001      Assessment   Medical Diagnosis  RT shoulder pain , cervicalgia    Referring Provider (PT)  Erskine Emery MD    Onset Date/Surgical Date  12/19/19   approximately   Hand Dominance  Right    Next MD Visit  Jan 27, 2020    Prior Therapy  none      Precautions   Precautions  None      Restrictions   Weight Bearing Restrictions  No      Balance Screen   Has the patient fallen in the past 6 months  No    Has the patient had a decrease in activity level because of a fear of falling?   No    Is the patient reluctant  to leave their home because of a fear of falling?   No      Home Environment   Living Environment  Private residence    Type of [place] Access  Stairs to enter    Entrance Stairs-Number of Steps  4    Entrance Stairs-Rails  Right    Home Layout  One level      Prior Function   Level of Independence  Independent    Vocation  Full time employment    Chiropodist   Overall Cognitive Status  Within Functional Limits for tasks assessed      Observation/Other Assessments   Focus on Therapeutic Outcomes (FOTO)   FOTO not taken      Sensation   Light Touch  Appears Intact      Posture/Postural Control   Posture/Postural Control  Postural limitations    Postural Limitations  Rounded Shoulders;Forward head;Anterior pelvic tilt    Posture Comments  RT shoulder depressed and forward      ROM / Strength   AROM / PROM / Strength  AROM;Strength      AROM   Overall AROM   Deficits    Right Shoulder Flexion  153 Degrees    Right Shoulder ABduction  161 Degrees    Left Shoulder Flexion  167 Degrees    Left Shoulder ABduction  169 Degrees    Cervical Flexion  45    Cervical Extension  56    Cervical - Right Side Bend  45     Cervical - Left Side Bend  38    Cervical - Right Rotation  55    Cervical - Left Rotation  46      Strength   Overall Strength  Deficits    Overall Strength Comments  decreased strength due to gaurding on RT    Right Shoulder Flexion  4/5    Right Shoulder Extension  4/5    Right Shoulder ABduction  4/5    Right Shoulder Internal Rotation  4+/5    Right Shoulder External Rotation  4+/5    Left Shoulder Flexion  5/5    Left Shoulder Extension  5/5    Left Shoulder ABduction  5/5    Left Shoulder Internal Rotation  5/5    Left Shoulder External Rotation  5/5      Special Tests    Special Tests  Cervical      Spurling's   Findings  Negative    Side  Right    Comment  neg bil                Objective measurements completed on examination: See above findings.      [facility] Adult PT Treatment/Exercise - 01/09/20 0001      Self-Care   Self-Care  Posture    Posture  initial sitting and standing posture and protection for neck      Neck Exercises: Stretches   Upper Trapezius Stretch  3 reps;30 seconds;Right    Upper Trapezius Stretch Limitations  VC and TC    Levator Stretch  Right;30 seconds;3 reps    Levator Stretch Limitations  VC and TC    Other Neck Stretches  shoulder rolls x 20             PT Education - 01/09/20 1012    Education Details  POC Explanation of findings intiial Posture/and initial HEP  Person(s) Educated  Patient;Other (comment)   interpreter Cesar   Methods  Explanation;Demonstration;Tactile cues;Verbal cues;Handout    Comprehension  Verbalized understanding;Returned demonstration       PT Short Term Goals - 01/09/20 1000      PT SHORT TERM GOAL #1   Title  STG=LTG        PT Long Term Goals - 01/09/20 [id number]      PT LONG TERM GOAL #1   Title  Demonstrate and verbalize techniques to reduce the risk of re-injury including: lifting, posture, body mechanics.    Time  6    Period  Weeks    Status  New    Target Date   02/20/20      PT LONG TERM GOAL #2   Title  Pt will be independent with advanced HEP.    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    Time  6    Period  Weeks    Status  New    Target Date  02/20/20           Access Code: [email or web address]  URL: [email or web address]  Date: 01/09/2020  Prepared by: Voncille Lo   Exercises  Seated Levator Scapulae Stretch - 2 reps - 1 sets - 30 hold - 2x daily - 7x weekly  Seated Upper Trapezius Stretch - 2 reps - 1 sets - 30 hold - 2x daily - 7x weekly  Seated Shoulder Rolls - 10 reps - 1 sets - 3x daily - 7x weekly     Plan - 01/09/20 1001    Clinical Impression Statement  34 yo female works in [employer] presents with 3 week history of RT neck/shoulder pain but not radiating past mid shoulder. Was a 9/10 pain but now she says better with 2/10 pain. Pt presents with signs and symptoms compatible with neck strain  and minor RT shoulder pain.  Ms Philbert Riser has some heaviness and tightness while driving and lifting laundry and mopping/sweeping.   Pt would benefit from skilled PT for 1 x a week for up to six weeks to address above impariments and functional limitations and return to pain-free PLOF    Examination-Participation Restrictions  Cleaning;Driving;Laundry    Stability/Clinical Decision Making   Stable/Uncomplicated    Clinical Decision Making  Low    Rehab Potential  Excellent    PT Frequency  1x / week    PT Duration  6 weeks    PT Treatment/Interventions  Cryotherapy;Electrical Stimulation;Iontophoresis 4mg /ml Dexamethasone;Moist Heat;Traction;Ultrasound;Therapeutic exercise;Therapeutic activities;Neuromuscular re-education;Patient/family education;Passive range of motion;Manual techniques;Dry needling;Taping;Joint Manipulations  PT Next Visit Plan  review HEP and add progressive sub scapular stabiizers    PT Home Exercise Plan  levator, upper trap shoulder rolls    Consulted and Agree with Plan of Care  Patient       Patient will benefit from skilled therapeutic intervention in order to improve the following deficits and impairments:  Pain, Impaired UE functional use, Improper body mechanics, Postural dysfunction, Decreased strength, Decreased range of motion  Visit Diagnosis: Cervicalgia  Acute pain of right shoulder  Abnormal posture  Muscle weakness (generalized)  Cramp and spasm     Problem List Patient Active Problem List   Diagnosis Date Noted  . Hydronephrosis of right kidney 12/03/2018  . GERD (gastroesophageal reflux disease) 04/06/2017   Voncille Lo, PT Certified Exercise Expert for the Aging Adult  01/09/20 10:19 AM Phone: [phone] Fax: [facility] [address] [place], [place], [address] Phone: [phone]   Fax:  [phone]  Name: Lisa Lin MRN: [id number] Date of Birth: 1986/04/19

## 2020-01-09 NOTE — Patient Instructions (Addendum)
Access Code: [email or web address]  URL: [email or web address]  Date: 01/09/2020  Prepared by: Voncille Lo   Exercises  Seated Levator Scapulae Stretch - 2 reps - 1 sets - 30 hold - 2x daily - 7x weekly  Seated Upper Trapezius Stretch - 2 reps - 1 sets - 30 hold - 2x daily - 7x weekly  Seated Shoulder Rolls - 10 reps - 1 sets - 3x daily - 7x weekly       Voncille Lo, PT Certified Exercise Expert for the Aging Adult  01/09/20 9:27 AM Phone: [phone] Fax: [phone]

## 2020-01-14 ENCOUNTER — Other Ambulatory Visit: Payer: Self-pay

## 2020-01-14 ENCOUNTER — Ambulatory Visit: Payer: Self-pay | Attending: Internal Medicine

## 2020-01-23 ENCOUNTER — Ambulatory Visit: Payer: Self-pay | Admitting: Physical Therapy

## 2020-01-27 ENCOUNTER — Other Ambulatory Visit: Payer: Self-pay

## 2020-01-27 ENCOUNTER — Ambulatory Visit (INDEPENDENT_AMBULATORY_CARE_PROVIDER_SITE_OTHER): Payer: Self-pay | Admitting: Physician Assistant

## 2020-01-27 ENCOUNTER — Encounter: Payer: Self-pay | Admitting: Physician Assistant

## 2020-01-27 DIAGNOSIS — M542 Cervicalgia: Secondary | ICD-10-CM

## 2020-01-27 NOTE — Progress Notes (Signed)
Office Visit Note   Patient: Lisa Lin           Date of Birth: [birth year]           MRN: [id number] Visit Date: 01/27/2020              Requested by: Ladell Pier, MD [address] [place],  [place] [address] PCP: Ladell Pier, MD   Assessment & Plan: Visit Diagnoses:  1. Cervicalgia     Plan:  Discussed with her at length that I am encouraged due to the fact that she is improving.  However if her symptoms persist or become worse definitely can get an MRI.  I would like for her to try physical therapy for at least the next 2 weeks and continue the home exercise program.  At at that end of 2 weeks if she is not 90 to 100% better then she will call our office.  Questions were encouraged and answered at length.  Interpreter was used throughout the encounter today to aid in communication.  Follow-Up Instructions: Return if symptoms worsen or fail to improve.   Orders:  No orders of the defined types were placed in this encounter.  No orders of the defined types were placed in this encounter.     Procedures: No procedures performed   Clinical Data: No additional findings.   Subjective: Chief Complaint  Patient presents with  . Follow-up    HPI Evelen returns today due to neck and right upper extremity pain.  She states she is greater than 50% better.  States her pain is 2 out of 10 pain at worst.  Still has frequent pain down her arm to the right wrist.  This also involves some numbness.  She feels the Medrol Dosepak helped.  She has been to 1 visit with physical therapy was given some home exercises to do which she states she is doing.  She is still having some aching pain down the right arm.  Patient speaks Spanish and interpreter is present today throughout the examination.  Review of Systems See HPI otherwise negative or noncontributory  Objective: Vital Signs: There were no vitals taken for this visit.  Physical Exam Constitutional:        Appearance: She is not ill-appearing or diaphoretic.  Pulmonary:     Effort: Pulmonary effort is normal.  Neurological:     Mental Status: She is alert and oriented to person, place, and time.  Psychiatric:        Mood and Affect: Mood normal.     Ortho Exam Cervical spine good range of motion.  Positive Spurling's.  Subjective decreased sensation right fourth and fifth fingers compared to the left.  Tenderness along medial border of the right scapula. Specialty Comments:  No specialty comments available.  Imaging: No results found.   PMFS History: Patient Active Problem List   Diagnosis Date Noted  . Hydronephrosis of right kidney 12/03/2018  . GERD (gastroesophageal reflux disease) 04/06/2017   Past Medical History:  Diagnosis Date  . Colitis   . Gastritis   . GERD (gastroesophageal reflux disease)   . Pyelonephritis   . Pyelonephritis     Family History  Problem Relation Age of Onset  . Diabetes Maternal Grandmother     Past Surgical History:  Procedure Laterality Date  . NO PAST SURGERIES     Social History   Occupational History  . Occupation: cleaning  Tobacco Use  . Smoking status:  Never Smoker  . Smokeless tobacco: Never Used  Substance and Sexual Activity  . Alcohol use: No    Alcohol/week: 0.0 standard drinks  . Drug use: No  . Sexual activity: Yes    Birth control/protection: Injection

## 2020-01-28 ENCOUNTER — Ambulatory Visit: Payer: Self-pay | Admitting: Internal Medicine

## 2020-01-30 ENCOUNTER — Other Ambulatory Visit: Payer: Self-pay

## 2020-01-30 ENCOUNTER — Ambulatory Visit: Payer: Self-pay | Admitting: Physical Therapy

## 2020-01-30 ENCOUNTER — Encounter: Payer: Self-pay | Admitting: Physical Therapy

## 2020-01-30 DIAGNOSIS — M542 Cervicalgia: Secondary | ICD-10-CM

## 2020-01-30 DIAGNOSIS — R252 Cramp and spasm: Secondary | ICD-10-CM

## 2020-01-30 DIAGNOSIS — M6281 Muscle weakness (generalized): Secondary | ICD-10-CM

## 2020-01-30 DIAGNOSIS — R293 Abnormal posture: Secondary | ICD-10-CM

## 2020-01-30 DIAGNOSIS — M25511 Pain in right shoulder: Secondary | ICD-10-CM

## 2020-01-30 NOTE — Patient Instructions (Signed)
        Voncille Lo, PT Certified Exercise Expert for the Aging Adult  01/30/20 9:01 AM Phone: [phone] Fax: [phone]

## 2020-01-30 NOTE — Therapy (Signed)
[place], [place], [address] Phone: [phone]   Fax:  [phone]  Physical Therapy Treatment  Patient Details  Name: Lisa Lin MRN: [id number] Date of Birth: Jul 24, 1986 Referring Provider (PT): Erskine Emery MD   Encounter Date: 01/30/2020  PT End of Session - 01/30/20 0853    Visit Number  2    Number of Visits  6    Date for PT Re-Evaluation  02/20/20    Authorization Type  CAFA    PT Start Time  0849    PT Stop Time  0946    PT Time Calculation (min)  57 min    Activity Tolerance  Patient tolerated treatment well    Behavior During Therapy  [facility] for tasks assessed/performed       Past Medical History:  Diagnosis Date  . Colitis   . Gastritis   . GERD (gastroesophageal reflux disease)   . Pyelonephritis   . Pyelonephritis     Past Surgical History:  Procedure Laterality Date  . NO PAST SURGERIES      There were no vitals filed for this visit.  Subjective Assessment - 01/30/20 0854    Subjective  I am doing better on my neck with exercises    Patient is accompained by:  Interpreter   Shanon Brow [phone]   Pertinent History  gastritis, pyelonephritis    Limitations  Lifting;House hold activities    Diagnostic tests  no abnormalities    Currently in Pain?  Yes    Pain Score  1     Pain Location  Neck    Pain Descriptors / Indicators  Sharp    Pain Type  Acute pain    Pain Score  2    Pain Location  Shoulder    Pain Orientation  Right    Pain Descriptors / Indicators  Sharp;Sore                       [facility] Adult PT Treatment/Exercise - 01/30/20 0001      Self-Care   Self-Care  Other Self-Care Comments    Other Self-Care Comments   education on TPDN and aftercar4e and precaustions        Shoulder Exercises: Standing   External Rotation  10 reps;Strengthening;Both;Theraband    Theraband Level (Shoulder External Rotation)  Level 3 (Green)    External  Rotation Weight (lbs)  VC and TC with interpreter    External Rotation Limitations  x 3    Extension  10 reps;Both;Strengthening;Theraband    Theraband Level (Shoulder Extension)  Level 3 (Green)    Extension Weight (lbs)  VC and TC with interpreter  cues for not elevatin trap and elbow straight    Extension Limitations  x3    Row  Strengthening;Both;10 reps;Theraband    Theraband Level (Shoulder Row)  Level 3 (Green)    Row Weight (lbs)  VC and TC with interpreter cues for proper execution    Row Limitations  x3      Modalities   Modalities  Moist Heat      Moist Heat Therapy   Number Minutes Moist Heat  15 Minutes    Moist Heat Location  Cervical      Manual Therapy   Manual Therapy  Soft tissue mobilization    Manual therapy comments  skilled palpation with TPDN    Soft tissue mobilization  bil cervicals and RT scapular and shoulder girdle  Neck Exercises: Stretches   Upper Trapezius Stretch  3 reps;30 seconds;Right    Upper Trapezius Stretch Limitations  VC and TC    Levator Stretch  Right;30 seconds;3 reps    Levator Stretch Limitations  VC and TC       Trigger Point Dry Needling - 01/30/20 0001    Consent Given?  Yes    Education Handout Provided  Yes   in Spanish and with interpreter   Muscles Treated Head and Neck  Upper trapezius;Levator scapulae;Cervical multifidi   RT only   Muscles Treated Upper Quadrant  Rhomboids   Rt only   Dry Needling Comments  40 mm 25 gage    Upper Trapezius Response  Twitch reponse elicited;Palpable increased muscle length    Levator Scapulae Response  Twitch response elicited;Palpable increased muscle length    Cervical multifidi Response  Twitch reponse elicited    Rhomboids Response  Palpable increased muscle length;Twitch response elicited           PT Education - 01/30/20 0911    Education Details  Added Subscapular strengthening to HEP and education on TPDN after care and precautians    Person(s) Educated   Patient;Other (comment)   interpreter   Methods  Explanation;Demonstration;Tactile cues;Verbal cues;Handout    Comprehension  Verbalized understanding;Returned demonstration       PT Short Term Goals - 01/09/20 1000      PT SHORT TERM GOAL #1   Title  STG=LTG        PT Long Term Goals - 01/09/20 [id number]      PT LONG TERM GOAL #1   Title  Demonstrate and verbalize techniques to reduce the risk of re-injury including: lifting, posture, body mechanics.    Time  6    Period  Weeks    Status  New    Target Date  02/20/20      PT LONG TERM GOAL #2   Title  Pt will be independent with advanced HEP.    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    Time  6    Period  Weeks    Status  New    Target Date  02/20/20            Plan - 01/30/20 0933    Clinical Impression Statement  Pt returns to  clinie with 1/10 neck pain and 2/10 upper trap and levator . Pt through interpreter consents to [facility] and is closely monitored throughout session.  Pt has increased tissue extensibility and relaxation after TPDN.  Pt also  added to HEP today.  continuing toward progress towards goals.    Examination-Participation Restrictions  Cleaning;Driving;Laundry    Stability/Clinical Decision Making  Stable/Uncomplicated    Clinical Decision Making  Low    Rehab Potential  Excellent    PT Frequency  1x /  week    PT Duration  8 weeks    PT Treatment/Interventions  Cryotherapy;Electrical Stimulation;Iontophoresis 4mg /ml Dexamethasone;Moist Heat;Traction;Ultrasound;Therapeutic exercise;Therapeutic activities;Neuromuscular re-education;Patient/family education;Passive range of motion;Manual techniques;Dry needling;Taping;Joint Manipulations    PT Next Visit Plan  assess TPDN  and progress as needed. Goals    PT Home Exercise Plan  levator, upper trap shoulder rolls sub scap stabiliers    Consulted and Agree with Plan of Care  Patient       Patient will benefit from skilled therapeutic intervention in order to improve the following deficits and impairments:  Pain, Impaired UE functional use, Improper body mechanics, Postural dysfunction, Decreased strength, Decreased range of motion  Visit Diagnosis: Cervicalgia  Acute pain of right shoulder  Abnormal posture  Muscle weakness (generalized)  Cramp and spasm     Problem List Patient Active Problem List   Diagnosis Date Noted  . Hydronephrosis of right kidney 12/03/2018  . GERD (gastroesophageal reflux disease) 04/06/2017    Voncille Lo, PT Certified Exercise Expert for the Aging Adult  01/30/20 10:27 AM Phone: [phone] Fax: [place] [facility] [address] [place], [place], [address] Phone: [phone]   Fax:  [phone]  Name: Lisa Lin MRN: [id number] Date of Birth: 05/12/1986

## 2020-01-31 ENCOUNTER — Ambulatory Visit: Payer: Self-pay

## 2020-02-06 ENCOUNTER — Other Ambulatory Visit: Payer: Self-pay

## 2020-02-06 ENCOUNTER — Ambulatory Visit: Payer: Self-pay | Attending: Physician Assistant | Admitting: Physical Therapy

## 2020-02-06 ENCOUNTER — Encounter: Payer: Self-pay | Admitting: Physical Therapy

## 2020-02-06 DIAGNOSIS — M6281 Muscle weakness (generalized): Secondary | ICD-10-CM | POA: Insufficient documentation

## 2020-02-06 DIAGNOSIS — R293 Abnormal posture: Secondary | ICD-10-CM | POA: Insufficient documentation

## 2020-02-06 DIAGNOSIS — M25511 Pain in right shoulder: Secondary | ICD-10-CM | POA: Insufficient documentation

## 2020-02-06 DIAGNOSIS — M542 Cervicalgia: Secondary | ICD-10-CM | POA: Insufficient documentation

## 2020-02-06 DIAGNOSIS — R252 Cramp and spasm: Secondary | ICD-10-CM | POA: Insufficient documentation

## 2020-02-06 NOTE — Therapy (Addendum)
[place], [place], [address] Phone: [phone]   Fax:  [phone]  Physical Therapy Treatment/Discharge Note  Patient Details  Name: Lisa Lin MRN: [id number] Date of Birth: 19-Aug-1986 Referring Provider (PT): Erskine Emery MD   Encounter Date: 02/06/2020  PT End of Session - 02/06/20 0853    Visit Number  3    Number of Visits  6    Date for PT Re-Evaluation  02/20/20    Authorization Type  CAFA    PT Start Time  0848    PT Stop Time  0945    PT Time Calculation (min)  57 min    Activity Tolerance  Patient tolerated treatment well    Behavior During Therapy  [facility] for tasks assessed/performed       Past Medical History:  Diagnosis Date  . Colitis   . Gastritis   . GERD (gastroesophageal reflux disease)   . Pyelonephritis   . Pyelonephritis     Past Surgical History:  Procedure Laterality Date  . NO PAST SURGERIES      There were no vitals filed for this visit.  Subjective Assessment - 02/06/20 0853    Subjective  I did fine with the TPDN  I was sore a couple of days. i am a 1/10 and my exericises are fine.  I do have sore arms.  (PT explained DOMS) Pt points to [place] that she has been exericising    Patient is accompained by:  Interpreter   Aarel # [phone]   Pertinent History  gastritis, pyelonephritis    Limitations  Lifting;House hold activities    Currently in Pain?  Yes    Pain Score  1     Pain Location  Neck    Pain Orientation  Right         [facility] PT Assessment - 02/06/20 0001      AROM   Cervical Flexion  51    Cervical Extension  60    Cervical - Right Side Bend  52    Cervical - Left Side Bend  46    Cervical - Right Rotation  55    Cervical - Left Rotation  56                   [facility] Adult PT Treatment/Exercise - 02/06/20 0001      Self-Care   Self-Care  Other Self-Care Comments    Other Self-Care Comments   education on DOMS  with strengtheining exericises       Shoulder Exercises: Standing   Other Standing Exercises  standing with 6# bil DB 2 x 10 shld abd , sholder scaption 3 x 10 with 6 # DB  , 3 x 10 OH press 6# bil 3 x 10      Modalities   Modalities  Moist Heat      Moist Heat Therapy   Number Minutes Moist Heat  15 Minutes    Moist Heat Location  Cervical      Manual Therapy   Manual Therapy  Soft tissue mobilization    Manual therapy comments  skilled palpation with TPDN    Soft tissue mobilization  bil cervicals and RT scapular and shoulder girdle       Trigger Point Dry Needling - 02/06/20 0001    Consent Given?  Yes    Education Handout Provided  Previously provided    Muscles Treated Head and Neck  Levator  scapulae;Upper trapezius;Scalenes    Muscles Treated Back/Hip  Thoracic multifidi    Dry Needling Comments  40 mm 25 gage    Upper Trapezius Response  Twitch reponse elicited;Palpable increased muscle length    Levator Scapulae Response  Twitch response elicited;Palpable increased muscle length    Scalenes Response  Twitch reponse elicited;Palpable increased muscle length   RT only   Thoracic multifidi response  Twitch response elicited;Palpable increased muscle length   T-2 to T-6 RT only            PT Short Term Goals - 01/09/20 1000      PT SHORT TERM GOAL #1   Title  STG=LTG        PT Long Term Goals - 02/06/20 0910      PT LONG TERM GOAL #1   Title  Demonstrate and verbalize techniques to reduce the risk of re-injury including: lifting, posture, body mechanics.    Time  6    Period  Weeks    Status  On-going      PT LONG TERM GOAL #2   Title  Pt will be independent with advanced HEP.    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    Baseline  Pt lifting 6 # each hand for total of 12 #    Time  6    Period  Weeks    Status  On-going            Plan - 02/06/20 1238    Clinical Impression Statement  Pt with 1/10 pain today. Pt through interpreter consents to [facility] and is closely monitored throughout session. Pt able to lift 6# in bil hands above head.  Pt increased AROM in every cervical motion. See flowchart.  Will continue toward completion of goals.    Examination-Participation Restrictions  Cleaning;Driving;Laundry    Clinical Decision Making  Low    Rehab Potential  Excellent    PT Frequency  1x / week    PT Duration  8 weeks    PT Treatment/Interventions  Cryotherapy;Electrical Stimulation;Iontophoresis '4mg'$ /ml Dexamethasone;Moist Heat;Traction;Ultrasound;Therapeutic exercise;Therapeutic activities;Neuromuscular re-education;Patient/family education;Passive range of motion;Manual techniques;Dry needling;Taping;Joint Manipulations    PT Next Visit Plan  assess TPDN  and progress as needed. explain ADL and body mechanics    PT Home Exercise Plan  levator, upper trap shoulder rolls sub scap stabiliers OH press and 6 # bil abd and scaption    Consulted and Agree with Plan of Care  Patient  Patient will benefit from skilled therapeutic intervention in order to improve the following deficits and impairments:  Pain, Impaired UE functional use, Improper body mechanics, Postural dysfunction, Decreased strength, Decreased range of motion  Visit  Diagnosis: Cervicalgia  Acute pain of right shoulder  Abnormal posture  Muscle weakness (generalized)  Cramp and spasm     Problem List Patient Active Problem List   Diagnosis Date Noted  . Hydronephrosis of right kidney 12/03/2018  . GERD (gastroesophageal reflux disease) 04/06/2017   Voncille Lo, PT Certified Exercise Expert for the Aging Adult  02/06/20 12:46 PM Phone: [phone] Fax: [facility] [address] [place], [place], [address] Phone: [phone]   Fax:  [phone]  Name: Lisa Lin MRN: [id number] Date of Birth: [birth year]   PHYSICAL THERAPY DISCHARGE SUMMARY  Visits from Start of Care: 3  Current functional level related to goals / functional outcomes: As above but pt called and stated she no longer needs PT and feeling better   Remaining deficits: None reported by pt   Education / Equipment: HEP  Plan: Patient agrees to discharge.  Patient goals were met. Patient is being discharged due to the patient's request.  ?????    Pt states he no longer needs PT Voncille Lo, PT Certified Exercise Expert for the Aging Adult  04/30/20 8:42 AM Phone: [phone] Fax: [phone]

## 2020-02-06 NOTE — Patient Instructions (Signed)
Sleeping on Side Place pillow between knees. Use cervical support under neck and a roll around waist as needed. Copyright  VHI. All rights reserved.

## 2020-02-13 ENCOUNTER — Ambulatory Visit: Payer: Self-pay | Admitting: Physical Therapy

## 2020-02-20 ENCOUNTER — Ambulatory Visit: Payer: Self-pay | Admitting: Physical Therapy

## 2020-03-09 ENCOUNTER — Other Ambulatory Visit: Payer: Self-pay

## 2020-03-09 ENCOUNTER — Ambulatory Visit: Payer: Self-pay | Attending: Internal Medicine

## 2020-03-09 DIAGNOSIS — Z3042 Encounter for surveillance of injectable contraceptive: Secondary | ICD-10-CM

## 2020-03-09 MED ORDER — MEDROXYPROGESTERONE ACETATE 150 MG/ML IM SUSP
150.0000 mg | Freq: Once | INTRAMUSCULAR | Status: AC
  Administered 2020-03-09: 150 mg via INTRAMUSCULAR

## 2020-03-09 NOTE — Progress Notes (Signed)
Last Depo-Provera:12/23/2019 Depo-provera was given in right ventrogulteal  Patient tolerated shot very well. Next depo-shot due June 21-July 5

## 2020-05-25 ENCOUNTER — Other Ambulatory Visit: Payer: Self-pay

## 2020-05-25 ENCOUNTER — Ambulatory Visit: Payer: Self-pay | Attending: Internal Medicine | Admitting: *Deleted

## 2020-05-25 DIAGNOSIS — Z3042 Encounter for surveillance of injectable contraceptive: Secondary | ICD-10-CM

## 2020-05-25 MED ORDER — MEDROXYPROGESTERONE ACETATE 150 MG/ML IM SUSP
150.0000 mg | Freq: Once | INTRAMUSCULAR | Status: AC
  Administered 2020-05-25: 150 mg via INTRAMUSCULAR

## 2020-05-25 NOTE — Progress Notes (Signed)
Date last pap: [id number]. Last Depo-Provera: 03/09/2020. Side Effects if any: n/a. Serum HCG indicated? n/a. Depo-Provera 150 mg IM given by T.Demarkis Gheen,RN. Next appointment due September 7,2021

## 2020-05-27 IMAGING — US US ABDOMEN COMPLETE
1 series · 14 of 25 positions shown · non-contrast
Comparison: None.

CLINICAL DATA: Right upper quadrant pain for 7 years.

EXAM:
ABDOMEN ULTRASOUND COMPLETE

[Series 1: us abdomen complete · 0.14mm/px · 14 of 97 slices shown]
[im 1/97]
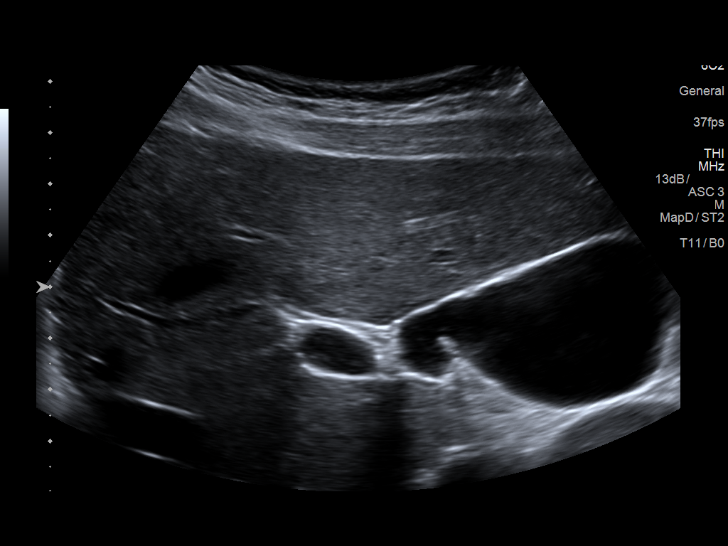
[im 9/97]
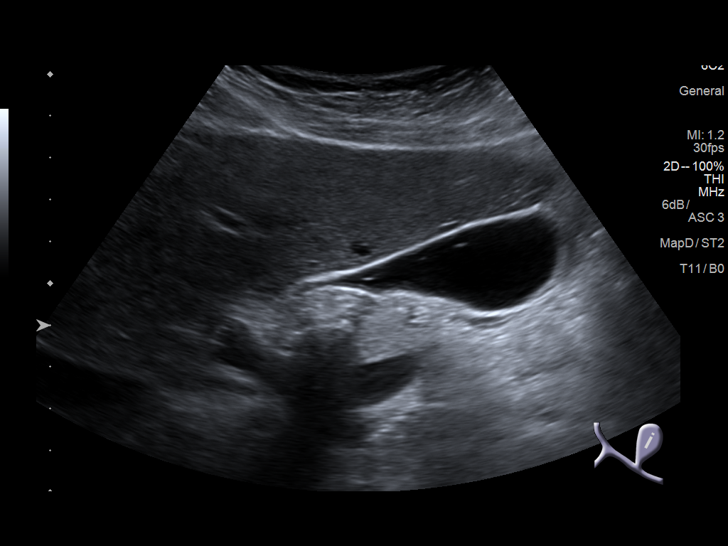
[im 17/97]
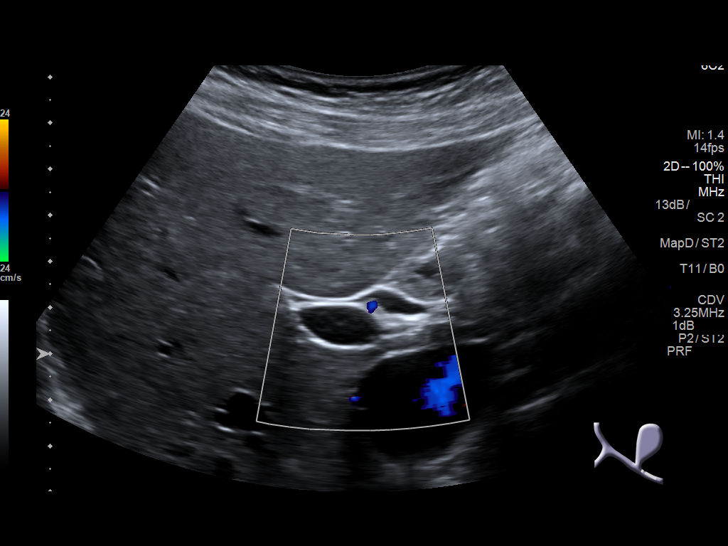
[im 25/97]
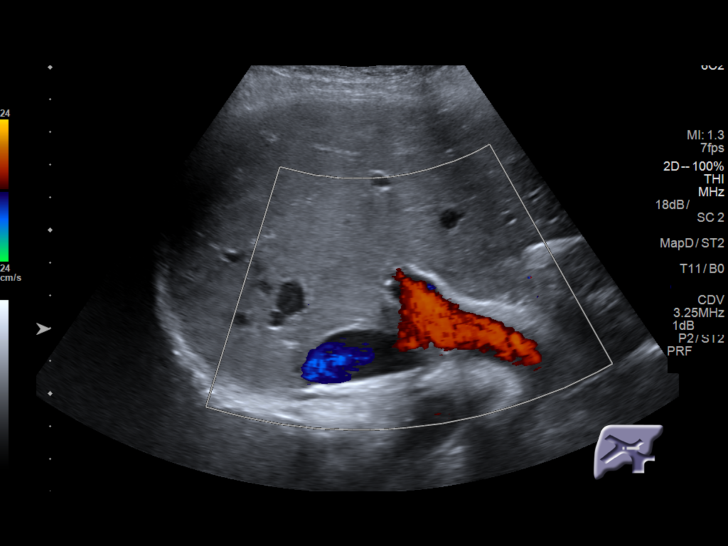
[im 33/97]
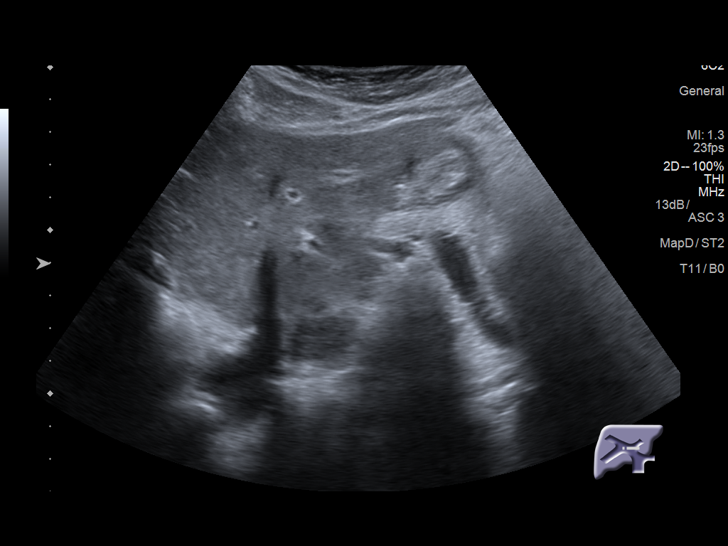
[im 37/97]
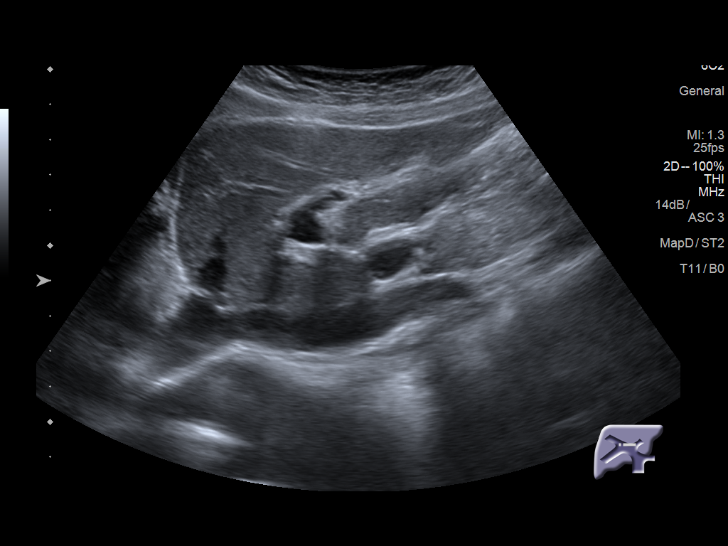
[im 45/97]
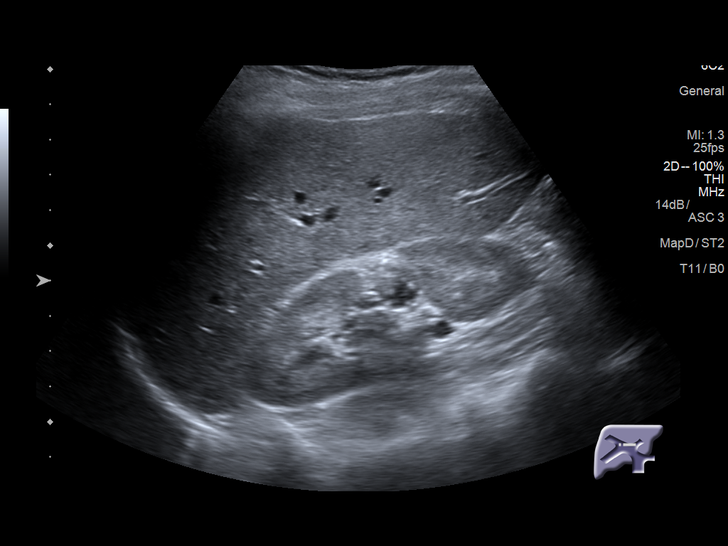
[im 53/97]
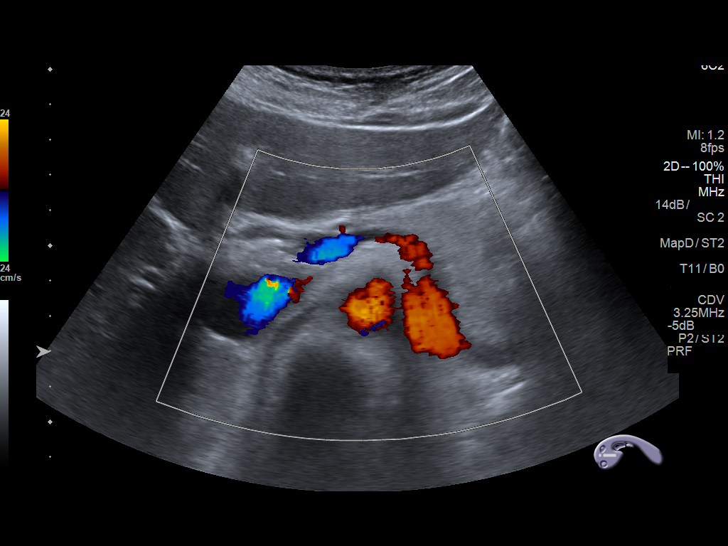
[im 61/97]
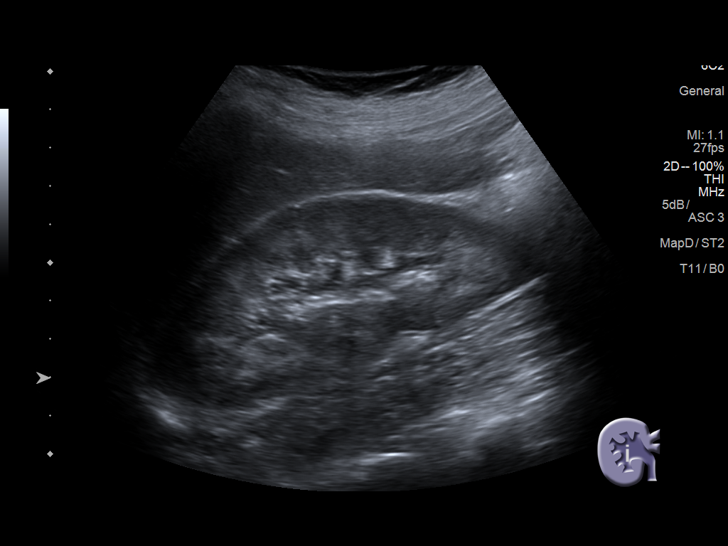
[im 65/97]
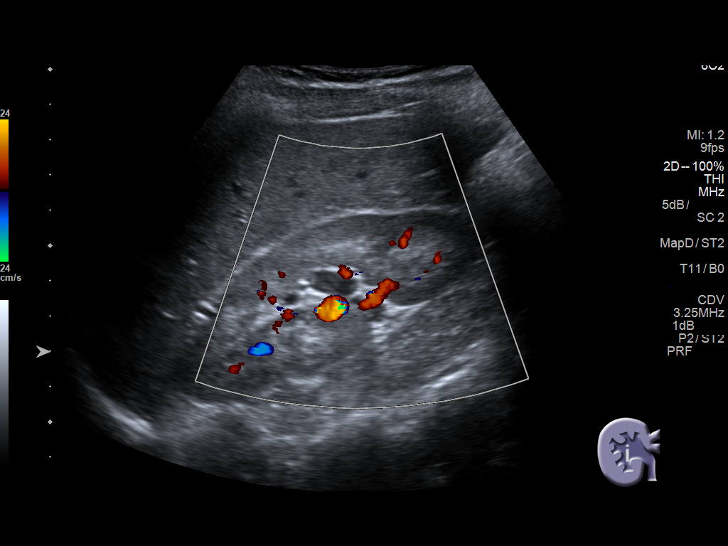
[im 73/97]
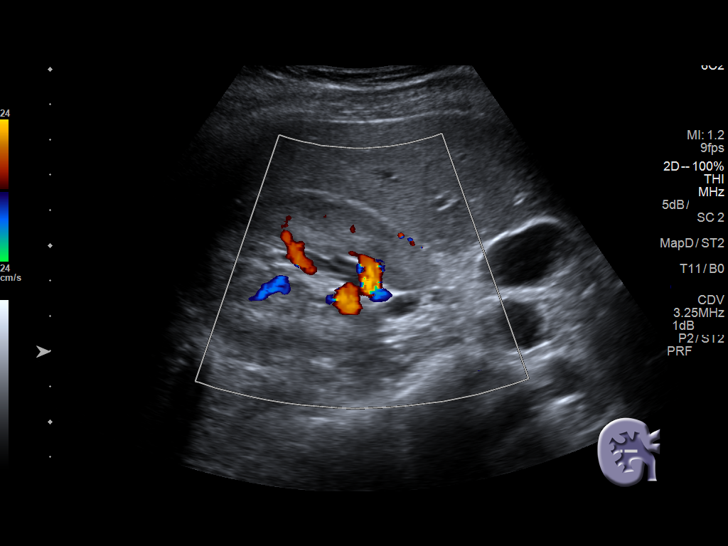
[im 81/97]
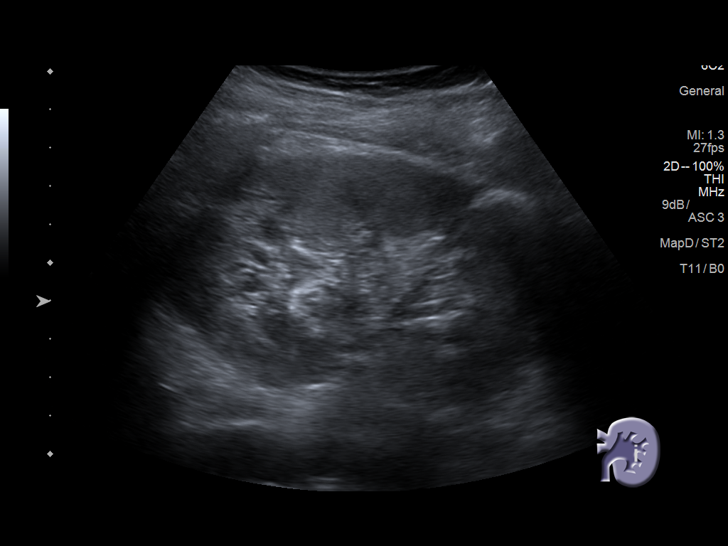
[im 89/97]
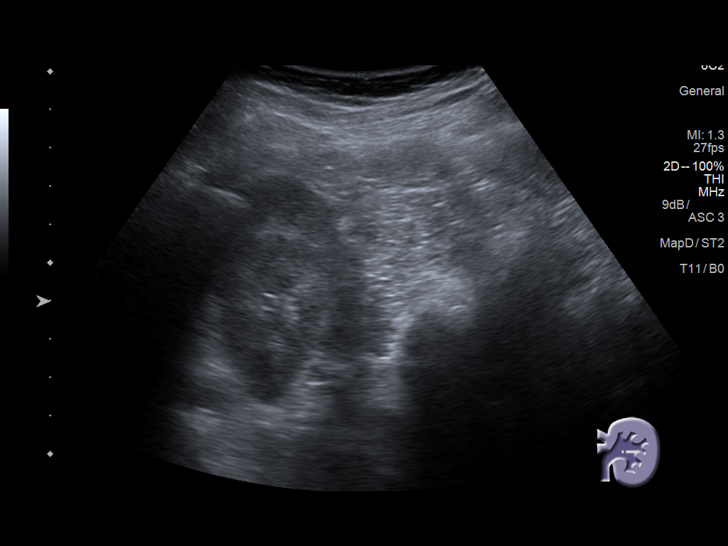
[im 97/97]
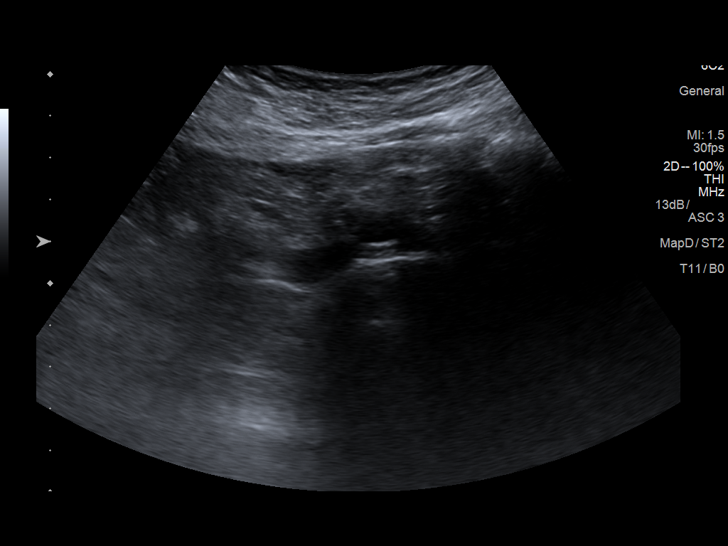

[14 of 25 positions shown; findings below may reference images not displayed]

FINDINGS: Gallbladder: No wall thickening visualized. There is sludge in the
gallbladder. No sonographic Murphy sign noted by sonographer.

Common bile duct: Diameter: 4.1 mm

Liver: No focal lesion identified. Within normal limits in
parenchymal echogenicity. Portal vein is patent on color Doppler
imaging with normal direction of blood flow towards the liver.

IVC: No abnormality visualized.

Pancreas: Visualized portion unremarkable.

Spleen: Size and appearance within normal limits.

Right Kidney: Length: 10.9 cm. Echogenicity within normal limits. No
mass visualized. There is mild right hydronephrosis.

Left Kidney: Length: 10.2 cm. Echogenicity within normal limits. No
mass or hydronephrosis visualized.

Abdominal aorta: No aneurysm visualized.

Other findings: None.
IMPRESSION: Mild right hydronephrosis.

Sludge in the gallbladder. No sonographic evidence of acute
cholecystitis.

## 2020-08-03 ENCOUNTER — Other Ambulatory Visit: Payer: Self-pay

## 2020-08-03 ENCOUNTER — Ambulatory Visit: Payer: Self-pay

## 2020-08-07 ENCOUNTER — Ambulatory Visit: Payer: Self-pay | Attending: Internal Medicine | Admitting: Internal Medicine

## 2020-08-07 ENCOUNTER — Other Ambulatory Visit: Payer: Self-pay

## 2020-08-07 DIAGNOSIS — M542 Cervicalgia: Secondary | ICD-10-CM

## 2020-08-07 DIAGNOSIS — D2230 Melanocytic nevi of unspecified part of face: Secondary | ICD-10-CM

## 2020-08-07 DIAGNOSIS — M25511 Pain in right shoulder: Secondary | ICD-10-CM

## 2020-08-07 DIAGNOSIS — G8929 Other chronic pain: Secondary | ICD-10-CM

## 2020-08-07 NOTE — Progress Notes (Signed)
Needing referral for PT for neck Needing referral for dermatology.

## 2020-08-07 NOTE — Progress Notes (Signed)
Virtual Visit via Telephone Note Due to current restrictions/limitations of in-office visits due to the COVID-19 pandemic, this scheduled clinical appointment was converted to a telehealth visit  I connected with Lisa Lin on 08/07/20 at 11:28 a.m EDT by telephone and verified that I am speaking with the correct person using two identifiers. I am in my office.  The patient is at home.  Only the patient,myself and Parlow from [employer] [phone]) participated in this encounter.  I discussed the limitations, risks, security and privacy concerns of performing an evaluation and management service by telephone and the availability of in person appointments. I also discussed with the patient that there may be a patient responsible charge related to this service. The patient expressed understanding and agreed to proceed.   History of Present Illness: Pt with hx of GERD.  This is an UC visit.  Pt requesting referral to dermatologist.  She has dark spots on her cheeks that she is concerned about.  Spots present x 4 yrs but now they have increased in size ad changed in color.  Request referral to P.T for  neck  And RT sided shoulder pain Saw ortho in 01/2020 for same.  Referred to P.T.  Had a few sessions which were helpful but had to cancel her remaining sessions to be at home with her 83 yr old daughter who is doing virtual classes from home and had started failing.      HM:  Agreeable to getting flu shot.   Outpatient Encounter Medications as of 08/07/2020  Medication Sig   omeprazole (PRILOSEC) 20 MG capsule Take 1 capsule (20 mg total) by mouth 2 (two) times daily before a meal.   cyclobenzaprine (FLEXERIL) 5 MG tablet Take 1 tablet (5 mg total) by mouth 3 (three) times daily as needed for muscle spasms. May take 2 at bedtime if needed for sleep (Patient not taking: Reported on 01/09/2020)   ibuprofen (ADVIL) 600 MG tablet Take 1 tablet (600 mg total) by mouth every 8  (eight) hours as needed for moderate pain. (Patient not taking: Reported on 01/09/2020)   predniSONE (DELTASONE) 20 MG tablet Take 3 pills today then 2 pills once per day for 2 days, 1 pill daily for 2 days then 1/2 pill daily for 2 days; Take after eating (Patient not taking: Reported on 01/09/2020)   promethazine (PHENERGAN) 12.5 MG tablet Take 1 tablet (12.5 mg total) by mouth every 8 (eight) hours as needed for nausea or vomiting. (Patient not taking: Reported on 01/09/2020)   No facility-administered encounter medications on file as of 08/07/2020.    Observations/Objective: No direct observation as this was a telephone encounter.  Assessment and Plan: 1. Cervicalgia - Ambulatory referral to Physical Therapy  2. Chronic right shoulder pain - Ambulatory referral to Physical Therapy  3. Change in facial mole - Ambulatory referral to Dermatology   Follow Up Instructions: PRN   I discussed the assessment and treatment plan with the patient. The patient was provided an opportunity to ask questions and all were answered. The patient agreed with the plan and demonstrated an understanding of the instructions.   The patient was advised to call back or seek an in-person evaluation if the symptoms worsen or if the condition fails to improve as anticipated.  I provided 8 minutes of non-face-to-face time during this encounter.   Karle Plumber, MD

## 2020-08-19 ENCOUNTER — Ambulatory Visit: Payer: Self-pay | Attending: Internal Medicine | Admitting: Pharmacist

## 2020-08-19 ENCOUNTER — Other Ambulatory Visit: Payer: Self-pay

## 2020-08-19 DIAGNOSIS — Z23 Encounter for immunization: Secondary | ICD-10-CM

## 2020-08-19 NOTE — Progress Notes (Signed)
Patient presents for vaccination against influenza per orders of Dr. Johnson. Consent given. Counseling provided. No contraindications exists. Vaccine administered without incident.  ° °Luke Van Ausdall, PharmD, CPP °Clinical Pharmacist °[facility] °[phone] ° °

## 2020-09-03 ENCOUNTER — Ambulatory Visit: Payer: Self-pay | Attending: Internal Medicine | Admitting: Physical Therapy

## 2020-09-03 ENCOUNTER — Other Ambulatory Visit: Payer: Self-pay

## 2020-09-03 DIAGNOSIS — M25511 Pain in right shoulder: Secondary | ICD-10-CM | POA: Insufficient documentation

## 2020-09-03 DIAGNOSIS — R293 Abnormal posture: Secondary | ICD-10-CM | POA: Insufficient documentation

## 2020-09-03 DIAGNOSIS — R252 Cramp and spasm: Secondary | ICD-10-CM | POA: Insufficient documentation

## 2020-09-03 DIAGNOSIS — M542 Cervicalgia: Secondary | ICD-10-CM | POA: Insufficient documentation

## 2020-09-03 DIAGNOSIS — M6281 Muscle weakness (generalized): Secondary | ICD-10-CM | POA: Insufficient documentation

## 2020-09-03 NOTE — Therapy (Signed)
Lisa Lin, [place], [address] Phone: [phone]   Fax:  [phone]  Physical Therapy Evaluation  Patient Details  Name: Lisa Lin MRN: [id number] Date of Birth: 1986-09-09 Referring Provider (PT): Lisa Plumber MD   Encounter Date: 09/03/2020   PT End of Session - 09/03/20 1016    Visit Number 1    Number of Visits 13    Date for PT Re-Evaluation 10/15/20    Authorization Type CAFA    PT Start Time 1016    PT Stop Time 1102    PT Time Calculation (min) 46 min    Activity Tolerance Patient limited by pain    Behavior During Therapy [facility] for tasks assessed/performed           Past Medical History:  Diagnosis Date  . Colitis   . Gastritis   . GERD (gastroesophageal reflux disease)   . Pyelonephritis   . Pyelonephritis     Past Surgical History:  Procedure Laterality Date  . NO PAST SURGERIES      There were no vitals filed for this visit.    Subjective Assessment - 09/03/20 1023    Subjective This pain started in my RT arm.  My doctor said my pain started in my neck and not my arm.  Nothing really happened.  It just hurt all of a sudden.   this is just a continuation of my visit from February   I am having the same problem. I have had no falls.  I have pain in Rt arm to Rt neck and it moves to my LT neck I hurt if I am sweeping, cleaning or fixing my hair.  I have trouble sleeping and getting comfortable at night.  I sleep 6 hours at night but I have trouble getting to sleep because I cant get comfortable. I dont really exercise much between job and kids    Patient is accompained by: Interpreter   Lisa Lin [phone]   Limitations House hold activities;Lifting;Standing;Sitting    How long can you sit comfortably? 22minutes    How long can you stand comfortably? 45 min    How long can you walk comfortably? not really walking for exericse    Diagnostic tests x ray    Currently in  Pain? Yes    Pain Score 2    at worst 6/10 and must lay down   Pain Location Neck    Pain Orientation Right;Left    Pain Descriptors / Indicators Burning    Pain Type Chronic pain    Pain Radiating Towards radiating to RT arm    Pain Onset More than a month ago    Pain Frequency Constant    Aggravating Factors  whenever i bend neck forward or carry  or pick up something from floor    Multiple Pain Sites Yes    Pain Score 3    Pain Location Shoulder    Pain Orientation Right    Pain Descriptors / Indicators Burning    Pain Type Chronic pain    Pain Onset More than a month ago    Pain Frequency Constant              [facility] PT Assessment - 09/03/20 0001      Assessment   Medical Diagnosis Cervicalgia Rt shoulder pain    Referring Provider (PT) Lisa Plumber MD    Onset Date/Surgical Date 12/19/19    Hand Dominance Right  Next MD Visit not scheduled    Prior Therapy yes in February 2021      Precautions   Precautions None      Restrictions   Weight Bearing Restrictions No      Balance Screen   Has the patient fallen in the past 6 months No    Has the patient had a decrease in activity level because of a fear of falling?  No    Is the patient reluctant to leave their home because of a fear of falling?  No      Home Social worker Private residence    Living Arrangements Children;Spouse/significant other    [place] to enter    Entrance Stairs-Number of Steps 4    Entrance Stairs-Rails Right    [place] One level      Prior Function   Level of Independence Independent    Vocation Part time employment    Tree surgeon   Overall Cognitive Status Within Functional Limits for tasks assessed      Observation/Other Assessments   Focus on Therapeutic Outcomes (FOTO)  CAFA MCD Spanish  NA      Posture/Postural Control   Posture/Postural Control Postural limitations    Postural Limitations  Rounded Shoulders;Forward head;Anterior pelvic tilt    Posture Comments Rt shoulder depressed and forward   flexed torso in sitting and sacral sitting      ROM / Strength   AROM / PROM / Strength AROM;Strength      AROM   Overall AROM  Deficits    Overall AROM Comments back scratch test  with decreased Abilty with Rt shold ER to touch IR LT   LT ER/RT IR able to touch fingers   RT/ER  LT IR back scratch 7 inches apart fingers    Right Shoulder Extension 30 Degrees    Right Shoulder Flexion 156 Degrees    Right Shoulder ABduction 155 Degrees    Right Shoulder Internal Rotation 45 Degrees    Right Shoulder External Rotation 90 Degrees    Left Shoulder Extension 30 Degrees    Left Shoulder Flexion 170 Degrees    Left Shoulder ABduction 167 Degrees    Left Shoulder Internal Rotation 64 Degrees    Left Shoulder External Rotation 95 Degrees    Cervical Flexion 44    Cervical Extension 53    Cervical - Right Side Bend 35    Cervical - Left Side Bend 47    Cervical - Right Rotation 55    Cervical - Left Rotation 45      Strength   Overall Strength Deficits    Overall Strength Comments pt supine unable to flex neck longer than 12 sec 1 inch from horizontal  ( Normal 35 sec) limited by pain    Right Shoulder Flexion 4/5    Right Shoulder Extension 4/5    Right Shoulder ABduction 4/5    Right Shoulder Internal Rotation 4/5    Right Shoulder External Rotation 4/5    Left Shoulder Flexion 5/5    Left Shoulder Extension 5/5    Left Shoulder ABduction 5/5    Left Shoulder Internal Rotation 5/5    Left Shoulder External Rotation 5/5      Palpation   Palpation comment TTP on RT medial border of scapular , tense bil UT and levatore and bil cervical paraspinals      Special Tests  Special Tests Cervical      Spurling's   Findings Positive    Side Right    Comment consistent with radicular symptoms      Distraction Test   Findngs Positive    side Right    Comment relief of burning  pain with distraction           Reviewed HEP with patient   Access Code: [email or web address]: [email or web address]: 09/30/2021Prepared by: Donnetta Simpers BeardsleyExercises  Seated Levator Scapulae Stretch - 2 x daily - 7 x weekly - 2 reps - 1 sets - 30 hold  Seated Upper Trapezius Stretch - 2 x daily - 7 x weekly - 2 reps - 1 sets - 30 hold  Seated Shoulder Rolls - 3 x daily - 7 x weekly - 10 reps - 1 sets  Shoulder External Rotation and Scapular Retraction with Resistance - 1 x daily - 7 x weekly - 3 sets - 10 reps  Scapular Retraction with Resistance - 2 x daily - 7 x weekly - 3 sets - 10 reps  Scapular Retraction with Resistance Advanced - 2 x daily - 7 x weekly - 10 reps - 3 sets          Objective measurements completed on examination: See above findings.       [facility] Adult PT Treatment/Exercise - 09/03/20 0001      Self-Care   Self-Care Posture    Posture initial posture sitting and standing      Shoulder Exercises: Standing   External Rotation Both;12 reps    Theraband Level (Shoulder External Rotation) Level 2 (Red)    External Rotation Limitations VC and TC for correct execution    Extension Strengthening;10 reps;Both    Theraband Level (Shoulder Extension) Level 2 (Red)    Extension Limitations x 2    Row Strengthening;10 reps;Theraband    Theraband Level (Shoulder Row) Level 2 (Red)    Row Limitations x2      Neck Exercises: Stretches   Upper Trapezius Stretch 2 reps;Right;Left;30 seconds    Upper Trapezius Stretch Limitations VC and TC    Levator Stretch 2 reps;Right;Left;30 seconds    Levator Stretch Limitations VC and TC    Other Neck Stretches scapular retraction x 10                  PT Education - 09/03/20 1231    Education Details POC explanation of findings  initial Posture and  HEP    Person(s) Educated Patient    Methods Explanation;Demonstration;Tactile cues;Verbal cues;Handout    Comprehension Verbalized  understanding;Returned demonstration;Other (comment)   with interpreter           PT Short Term Goals - 09/03/20 1248      PT SHORT TERM GOAL #1   Title STG=LTG             PT Long Term Goals - 09/03/20 1104      PT LONG TERM GOAL #1   Title Demonstrate and verbalize techniques to reduce the risk of re-injury including: lifting, posture, body mechanics for job cleaning homes    Baseline Pt sacral sitting with forward head and rounded shoulders    Time 6    Period Weeks    Status New    Target Date 10/15/20      PT LONG TERM GOAL #2   Title Pt will be independent with advanced HEP.    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    Baseline Unable to sweep or lift cleaning items for job    Time 6    Period Weeks    Status New    Target Date 10/15/20                  Plan - 09/03/20 1238    Clinical Impression Statement Pt is 34 yo female familar to clinic with  c/o of increased neck pain with radiating pain into R  UE. since January 2021 .  Attended PT and improved but back with same radicular symptoms but did admit she had not had time due to job and parenting  /housekeeping to be consistent with exericise. Pt presents with signs and symptoms compatible with cervical radiculopathy due to disc derangement. with postiive spurlings test and buring nerve pain. Pt would benefit from skilled PT for 2 times a week for 6 weeks to address above impariments and functional limitations and return to pain-free PLOF.    Examination-Participation Restrictions Driving;Cleaning;Laundry    Stability/Clinical Decision Making Stable/Uncomplicated    Clinical Decision Making Low    Rehab Potential Good    PT Frequency 2x / week    PT Duration 6 weeks    PT Treatment/Interventions ADLs/Self Care Home Management;Cryotherapy;Electrical Stimulation;Iontophoresis 4mg /ml Dexamethasone;Moist Heat;Traction;Ultrasound;Therapeutic exercise;Therapeutic activities;Neuromuscular re-education;Patient/family education;Manual techniques;Passive range of motion;Dry needling;Taping;Spinal Manipulations;Joint Manipulations    PT Next Visit Plan Pt has had TPDN in past and has benefitted.  [place] HEP given last time. Needs interpreter.    PT Home Exercise Plan [email or web address]    Consulted and Agree with Plan of Care Patient;Other (Comment)   interpreter          Patient will benefit from skilled therapeutic intervention in order to improve the following deficits and impairments:  Decreased range of motion, Decreased strength, Hypermobility, Increased muscle spasms, Impaired UE functional use, Postural dysfunction, Improper body mechanics, Pain  Visit Diagnosis: Cervicalgia  Acute pain of right shoulder  Abnormal posture  Muscle weakness (generalized)  Cramp and spasm     Problem List Patient Active Problem List   Diagnosis Date Noted  . Hydronephrosis of right kidney 12/03/2018  . GERD (gastroesophageal reflux disease) 04/06/2017    Voncille Lo, PT, [place] Certified Exercise Expert for the Aging Adult  09/03/20 12:51 PM Phone: [phone] Fax: [place] [facility] [address] [place], [place], [address] Phone: [phone]   Fax:  [phone]  Name: [facility] Lisa Lin MRN: [id number] Date of Birth: 27-May-1986

## 2020-09-03 NOTE — Patient Instructions (Signed)
   Lisa Lin, PT, [place] Certified Exercise Expert for the Aging Adult  09/03/20 11:04 AM Phone: [phone] Fax: [phone]

## 2020-09-08 ENCOUNTER — Other Ambulatory Visit: Payer: Self-pay

## 2020-09-08 ENCOUNTER — Encounter: Payer: Self-pay | Admitting: Physical Therapy

## 2020-09-08 ENCOUNTER — Ambulatory Visit: Payer: Self-pay | Attending: Internal Medicine | Admitting: Physical Therapy

## 2020-09-08 DIAGNOSIS — M542 Cervicalgia: Secondary | ICD-10-CM | POA: Insufficient documentation

## 2020-09-08 DIAGNOSIS — M25511 Pain in right shoulder: Secondary | ICD-10-CM | POA: Insufficient documentation

## 2020-09-08 DIAGNOSIS — R293 Abnormal posture: Secondary | ICD-10-CM | POA: Insufficient documentation

## 2020-09-08 DIAGNOSIS — R252 Cramp and spasm: Secondary | ICD-10-CM | POA: Insufficient documentation

## 2020-09-08 DIAGNOSIS — M6281 Muscle weakness (generalized): Secondary | ICD-10-CM | POA: Insufficient documentation

## 2020-09-08 NOTE — Therapy (Signed)
[place], [place], [address] Phone: [phone]   Fax:  [phone]  Physical Therapy Treatment  Patient Details  Name: Lisa Lin MRN: [id number] Date of Birth: 04/30/1986 Referring Provider (PT): Karle Plumber MD   Encounter Date: 09/08/2020   PT End of Session - 09/08/20 0848    Visit Number 2    Number of Visits 13    Date for PT Re-Evaluation 10/15/20    Authorization Type CAFA    PT Start Time 0847    PT Stop Time 0944    PT Time Calculation (min) 57 min    Activity Tolerance Patient tolerated treatment well    Behavior During Therapy [facility] for tasks assessed/performed           Past Medical History:  Diagnosis Date   Colitis    Gastritis    GERD (gastroesophageal reflux disease)    Pyelonephritis    Pyelonephritis     Past Surgical History:  Procedure Laterality Date   NO PAST SURGERIES      There were no vitals filed for this visit.   Subjective Assessment - 09/08/20 0852    Patient is accompained by: Interpreter    Limitations House hold activities;Lifting;Standing;Sitting    Diagnostic tests x ray    Patient Stated Goals decrease pain    Currently in Pain? Yes    Pain Score 2     Pain Location Neck    Pain Orientation Right;Left;Posterior    Pain Descriptors / Indicators Burning    Pain Type Chronic pain    Pain Onset More than a month ago    Pain Frequency Constant                             [facility] Adult PT Treatment/Exercise - 09/08/20 0001      Shoulder Exercises: Standing   External Rotation Both;12 reps    Theraband Level (Shoulder External Rotation) Level 2 (Red)    External Rotation Limitations  x 2VC and TC for correct execution    Extension Strengthening;10 reps;Both    Theraband Level (Shoulder Extension) Level 2 (Red)    Extension Limitations x 2    Row Strengthening;10 reps;Theraband    Theraband Level  (Shoulder Row) Level 2 (Red)    Row Limitations x2      Modalities   Modalities Moist Heat      Moist Heat Therapy   Number Minutes Moist Heat 12 Minutes    Moist Heat Location Cervical      Manual Therapy   Manual Therapy Joint mobilization;Soft tissue mobilization    Manual therapy comments skilled palpation for TPDN    Joint Mobilization Thoracic PA and RT UPA lateral C- 4to C-5 RT    Soft tissue mobilization STW bil cervical paraspinals Uppper trap and levators and RT scalenes  sup occiipital release      Neck Exercises: Stretches   Upper Trapezius Stretch 2 reps;Right;Left;30 seconds    Upper Trapezius Stretch Limitations VC and TC    Levator Stretch 2 reps;Right;Left;30 seconds    Levator Stretch Limitations VC and TC    Other Neck Stretches scapular retraction x 10            Trigger Point Dry Needling - 09/08/20 0001    Consent Given? Yes    Education Handout Provided Yes    Muscles Treated Head and Neck Upper trapezius;Levator scapulae;Cervical multifidi;Suboccipitals  Dry Needling Comments 40 mm  .30 gage    Upper Trapezius Response Twitch reponse elicited;Palpable increased muscle length   bil   Suboccipitals Response Palpable increased muscle length   Rt only   Levator Scapulae Response Twitch response elicited;Palpable increased muscle length   bil   Cervical multifidi Response Twitch reponse elicited;Palpable increased muscle length   RT C-2 to C-4               PT Education - 09/08/20 0855    Education Details education with interpreter on TPDN    Person(s) Educated Patient    Methods Explanation    Comprehension Verbalized understanding            PT Short Term Goals - 09/03/20 1248      PT SHORT TERM GOAL #1   Title STG=LTG             PT Long Term Goals - 09/03/20 1104      PT LONG TERM GOAL #1   Title Demonstrate and verbalize techniques to reduce the risk of re-injury including: lifting, posture, body mechanics for job cleaning  homes    Baseline Pt sacral sitting with forward head and rounded shoulders    Time 6    Period Weeks    Status New    Target Date 10/15/20      PT LONG TERM GOAL #2   Title Pt will be independent with advanced HEP.    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    Baseline Unable to sweep or lift cleaning items for job    Time 6    Period Weeks    Status New    Target Date 10/15/20                 Plan - 09/08/20 0943    Clinical Impression Statement Ms Lisa Lin presents with 2/10 Pain in RT neck but on palpation especially of RT sub occipital and Rt scalenes up to 6/10.  Pt consents to TPDN which she has had in clinic at previous visits earlier in year.  She consents and is closely monitored through out session. Pt has decreased tissue tension post TPDN.  Pt iniitally reviewed HEP for strength and neck stretching to use after TPDN.  Pt needs to increased neck and subscapular strength.  Will continue POC and TPDN as needed  Examination-Participation Restrictions Driving;Cleaning;Laundry    PT Frequency 2x / week    PT Duration 6 weeks    PT Treatment/Interventions  ADLs/Self Care Home Management;Cryotherapy;Electrical Stimulation;Iontophoresis 4mg /ml Dexamethasone;Moist Heat;Traction;Ultrasound;Therapeutic exercise;Therapeutic activities;Neuromuscular re-education;Patient/family education;Manual techniques;Passive range of motion;Dry needling;Taping;Spinal Manipulations;Joint Manipulations    PT Next Visit Plan Pt would benefit from increasing strength. 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  Modaliaties/manual for RT suc occipital. Rt scalenes.  TPDN RT scalenes    PT Home Exercise Plan [id number]    Consulted and Agree with Plan of Care Patient;Other (Comment)           Patient will benefit from skilled therapeutic intervention in order to improve the following deficits and impairments:  Decreased range of motion, Decreased strength, Hypermobility, Increased muscle spasms, Impaired UE functional use, Postural dysfunction, Improper body mechanics, Pain  Visit Diagnosis: Cervicalgia  Acute pain of right shoulder  Abnormal posture  Muscle weakness (generalized)  Cramp and spasm     Problem List Patient Active Problem List   Diagnosis Date Noted   Hydronephrosis of right kidney 12/03/2018   GERD (gastroesophageal reflux disease) 04/06/2017    Voncille Lo, PT, [place] Certified Exercise Expert for the Aging Adult  09/08/20 9:52 AM Phone: [phone] Fax: [facility] [address] [facility], [place], [address] Phone: [phone]   Fax:  [phone]  Name: Lisa Lin MRN: [id number] Date of Birth: 1986-01-12

## 2020-09-08 NOTE — Patient Instructions (Signed)
° °  Voncille Lo, PT, [place] Certified Exercise Expert for the Aging Adult  09/08/20 8:55 AM Phone: [phone] Fax: [phone]

## 2020-09-16 ENCOUNTER — Encounter: Payer: Self-pay | Admitting: Family Medicine

## 2020-09-18 ENCOUNTER — Ambulatory Visit: Payer: Self-pay | Admitting: Physical Therapy

## 2020-09-22 ENCOUNTER — Encounter: Payer: Self-pay | Admitting: Physical Therapy

## 2020-09-22 ENCOUNTER — Encounter: Payer: Self-pay | Admitting: Internal Medicine

## 2020-09-22 ENCOUNTER — Other Ambulatory Visit: Payer: Self-pay

## 2020-09-22 ENCOUNTER — Ambulatory Visit: Payer: Self-pay | Admitting: Physical Therapy

## 2020-09-22 DIAGNOSIS — R252 Cramp and spasm: Secondary | ICD-10-CM

## 2020-09-22 DIAGNOSIS — M6281 Muscle weakness (generalized): Secondary | ICD-10-CM

## 2020-09-22 DIAGNOSIS — M25511 Pain in right shoulder: Secondary | ICD-10-CM

## 2020-09-22 DIAGNOSIS — M542 Cervicalgia: Secondary | ICD-10-CM

## 2020-09-22 DIAGNOSIS — R293 Abnormal posture: Secondary | ICD-10-CM

## 2020-09-22 NOTE — Therapy (Signed)
[place], [place], [address] Phone: [phone]   Fax:  [phone]  Physical Therapy Treatment  Patient Details  Name: Lisa Lin MRN: [id number] Date of Birth: 1986-09-03 Referring Provider (PT): Karle Plumber MD   Encounter Date: 09/22/2020   PT End of Session - 09/22/20 0848    Visit Number 3    Number of Visits 13    Date for PT Re-Evaluation 10/15/20    Authorization Type CAFA    PT Start Time 0848   pt arrived late   PT Stop Time 0927    PT Time Calculation (min) 39 min    Activity Tolerance Patient tolerated treatment well    Behavior During Therapy [facility] for tasks assessed/performed           Past Medical History:  Diagnosis Date  . Colitis   . Gastritis   . GERD (gastroesophageal reflux disease)   . Pyelonephritis   . Pyelonephritis     Past Surgical History:  Procedure Laterality Date  . NO PAST SURGERIES      There were no vitals filed for this visit.   Subjective Assessment - 09/22/20 0855    Subjective Level 2-3/10, able to sleep on my belly without pillow, hot pad makes it better, dry needling helped last visit, sleeping on back without pillow hurts    Currently in Pain? Yes    Pain Score 2     Pain Orientation Right    Aggravating Factors  sleeping on back without    Pain Relieving Factors hot pad              [facility] PT Assessment - 09/22/20 0001      Assessment   Medical Diagnosis Cervicalgia Rt shoulder pain    Referring Provider (PT) Karle Plumber MD                         [facility] Adult PT Treatment/Exercise - 09/22/20 0001      Self-Care   Posture Sleeping posture, neutral head position with appropriate pillow in supine to decrease UT tightness and pain      Manual Therapy   Manual Therapy Other (comment)    Manual therapy comments skilled palpation for TPDN    Joint Mobilization Thoracic PA central PA grade IV T1-T7,  bil rib inferior mobs    Soft tissue mobilization IASTM bil upper trap    Other Manual Therapy R upper trap inhibition taping      Neck Exercises: Stretches   Upper Trapezius Stretch 1 rep;Left;Right;30 seconds    Levator Stretch 1 rep;Left;Right;30 seconds            Trigger Point Dry Needling - 09/22/20 0001    Consent Given? Yes    Education Handout Provided Previously provided    Upper Trapezius Response Twitch reponse elicited;Palpable increased muscle length   bil               PT Education - 09/22/20 0930    Education Details Educated about posture, reviewed stretching, sleeping posture.    Person(s) Educated Patient    Methods Explanation;Demonstration    Comprehension Verbalized understanding            PT Short Term Goals - 09/03/20 1248      PT SHORT TERM GOAL #1   Title STG=LTG             PT Long Term Goals -  09/03/20 1104      PT LONG TERM GOAL #1   Title Demonstrate and verbalize techniques to reduce the risk of re-injury including: lifting, posture, body mechanics for job cleaning homes    Baseline Pt sacral sitting with forward head and rounded shoulders    Time 6    Period Weeks    Status New    Target Date 10/15/20      PT LONG TERM GOAL #2   Title Pt will be independent with advanced HEP.    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    Baseline Unable to sweep or lift cleaning items for job    Time 6    Period Weeks    Status New    Target Date 10/15/20                 Plan - 09/22/20 0947    Clinical Impression Statement Pt presents with 2/10 pain at all times localized to B/L UT that limits sleeping to stomach, work activities. TPDN was consented and performed on B/L UT and was closely monitorered during the procedure; results in decreased tissue tension. McConnell taping was applied to the R UT due to the increased severity of that side and the pt was educated on tape maintenance. Pt was instructued to continue previous HEP and to procure a small pillow and attempt supine sleeping to decrease neck pain.    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 Pt responded well to manual therapy and TPDN.           Patient will benefit from skilled therapeutic intervention in order to improve the following deficits and impairments:  Decreased range of motion, Decreased strength, Hypermobility, Increased muscle spasms, Impaired UE functional use, Postural dysfunction, Improper body mechanics, Pain  Visit  Diagnosis: Cervicalgia  Acute pain of right shoulder  Abnormal posture  Muscle weakness (generalized)  Cramp and spasm     Problem List Patient Active Problem List   Diagnosis Date Noted  . Hydronephrosis of right kidney 12/03/2018  .  GERD (gastroesophageal reflux disease) 04/06/2017    Starr Lake PT, DPT, LAT, ATC  09/22/20  10:48 AM      [facility] [address] [place], [place], [address] Phone: [phone]   Fax:  [phone]  Name: Devonne Kitchen MRN: [id number] Date of Birth: 09/08/1986

## 2020-09-24 ENCOUNTER — Other Ambulatory Visit: Payer: Self-pay

## 2020-09-24 ENCOUNTER — Encounter: Payer: Self-pay | Admitting: Physical Therapy

## 2020-09-24 ENCOUNTER — Ambulatory Visit: Payer: Self-pay | Admitting: Physical Therapy

## 2020-09-24 DIAGNOSIS — R293 Abnormal posture: Secondary | ICD-10-CM

## 2020-09-24 DIAGNOSIS — M25511 Pain in right shoulder: Secondary | ICD-10-CM

## 2020-09-24 DIAGNOSIS — M542 Cervicalgia: Secondary | ICD-10-CM

## 2020-09-24 NOTE — Therapy (Signed)
[place], [place], [address] Phone: [phone]   Fax:  [phone]  Physical Therapy Treatment  Patient Details  Name: Lisa Lin MRN: [id number] Date of Birth: Apr 10, 1986 Referring Provider (PT): Karle Plumber MD   Encounter Date: 09/24/2020   PT End of Session - 09/24/20 1112    Visit Number 4    Number of Visits 13    Date for PT Re-Evaluation 10/15/20    Authorization Type CAFA    PT Start Time 1112   pt arrived 12 min late   PT Stop Time 1142    PT Time Calculation (min) 30 min    Activity Tolerance Patient tolerated treatment well    Behavior During Therapy [facility] for tasks assessed/performed           Past Medical History:  Diagnosis Date  . Colitis   . Gastritis   . GERD (gastroesophageal reflux disease)   . Pyelonephritis   . Pyelonephritis     Past Surgical History:  Procedure Laterality Date  . NO PAST SURGERIES      There were no vitals filed for this visit.   Subjective Assessment - 09/24/20 1113    Subjective "I am feeling better today. I am sore some but I think it is from the needles. I have tried sleeping with the pillows"    Patient Stated Goals decrease pain    Currently in Pain? Yes    Pain Score 1     Pain Location Neck    Pain Orientation Right    Pain Descriptors / Indicators Tightness    Pain Onset More than a month ago                             [facility] Adult PT Treatment/Exercise - 09/24/20 0001      Self-Care   Posture how to perform self trigger point release using theracane      Shoulder Exercises: Standing   Protraction Strengthening   12 reps wall push up with plus   Row 12 reps;Theraband    Theraband Level (Shoulder Row) Level 3 (Green)    Row Limitations x 2     Other Standing Exercises lower trap wall y's 1 x 12       Manual Therapy   Manual therapy comments MTPR along R upper trap/ levator scapulae    combined witn education on how to peroform using theracane   Other Manual Therapy bil upper trap inhibition taping      Neck Exercises: Stretches   Upper Trapezius Stretch Right;1 rep;30 seconds    Levator Stretch 1 rep;Right;30 seconds                  PT Education - 09/24/20 1143    Education Details Reviewed HEP and how to perform self trigger point release techniques and tools that can assist with treatment.    Person(s) Educated Patient    Methods Explanation;Verbal cues;Demonstration    Comprehension Verbalized understanding;Verbal cues required;Returned demonstration            PT Short Term Goals - 09/03/20 1248      PT SHORT TERM GOAL #1   Title STG=LTG             PT Long Term Goals - 09/03/20 1104      PT LONG TERM GOAL #1   Title Demonstrate and verbalize techniques to reduce  the risk of re-injury including: lifting, posture, body mechanics for job cleaning homes    Baseline Pt sacral sitting with forward head and rounded shoulders    Time 6    Period Weeks    Status New    Target Date 10/15/20      PT LONG TERM GOAL #2   Title Pt will be independent with advanced HEP.    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    Baseline Unable to sweep or lift cleaning items for job    Time 6    Period Weeks    Status New    Target Date 10/15/20                 Plan - 09/24/20 1144    Clinical Impression Statement Limited session today due to pt arriving late. held off on DN focusing on MTPR and how it can be performed at home with tools. Continued working on scapular stability. she reports improvement in pain since the previous session and has been trying to sleep with a pillow noting improvment. continued bil upper trap inhibition taping which she reported relief of tension.    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 continued DN PRN for upper trap/ levator scapulae R>L.    Consulted and Agree with Plan of Care Patient           Patient will benefit from skilled therapeutic intervention in order to improve the following deficits and impairments:     Visit Diagnosis: Cervicalgia  Acute pain of right shoulder  Abnormal posture     Problem List Patient Active Problem List   Diagnosis Date Noted  . Hydronephrosis of right kidney 12/03/2018  . GERD (gastroesophageal reflux disease) 04/06/2017   Starr Lake PT, DPT, LAT, ATC  09/24/20  11:47 AM      [facility] [address] [place], [place], [address] Phone: [phone]   Fax:  [phone]  Name: Lisa Lin MRN: [id number] Date of Birth: [birth year]

## 2020-10-01 ENCOUNTER — Ambulatory Visit: Payer: Self-pay | Admitting: Physical Therapy

## 2020-10-06 ENCOUNTER — Encounter: Payer: Self-pay | Admitting: Physical Therapy

## 2020-10-06 ENCOUNTER — Other Ambulatory Visit: Payer: Self-pay

## 2020-10-06 ENCOUNTER — Ambulatory Visit: Payer: Self-pay | Attending: Internal Medicine | Admitting: Physical Therapy

## 2020-10-06 DIAGNOSIS — M25511 Pain in right shoulder: Secondary | ICD-10-CM | POA: Insufficient documentation

## 2020-10-06 DIAGNOSIS — R252 Cramp and spasm: Secondary | ICD-10-CM | POA: Insufficient documentation

## 2020-10-06 DIAGNOSIS — M6281 Muscle weakness (generalized): Secondary | ICD-10-CM | POA: Insufficient documentation

## 2020-10-06 DIAGNOSIS — M542 Cervicalgia: Secondary | ICD-10-CM | POA: Insufficient documentation

## 2020-10-06 DIAGNOSIS — R293 Abnormal posture: Secondary | ICD-10-CM | POA: Insufficient documentation

## 2020-10-06 NOTE — Therapy (Signed)
[place], [place], [address] Phone: [phone]   Fax:  [phone]  Physical Therapy Treatment  Patient Details  Name: Lisa Lin MRN: [id number] Date of Birth: 1986/10/29 Referring Provider (PT): Karle Plumber MD   Encounter Date: 10/06/2020   PT End of Session - 10/06/20 1015    Visit Number 5    Number of Visits 13    Date for PT Re-Evaluation 10/15/20    Authorization Type CAFA    PT Start Time 0929    PT Stop Time 1013    PT Time Calculation (min) 44 min    Activity Tolerance Patient tolerated treatment well    Behavior During Therapy [facility] for tasks assessed/performed           Past Medical History:  Diagnosis Date  . Colitis   . Gastritis   . GERD (gastroesophageal reflux disease)   . Pyelonephritis   . Pyelonephritis     Past Surgical History:  Procedure Laterality Date  . NO PAST SURGERIES      There were no vitals filed for this visit.   Subjective Assessment - 10/06/20 0933    Subjective Pt. reports neck doing well but still having some pain in right upper trapezius region this AM 1/10. Dry needling has been helpful.    Patient is accompained by: Interpreter   Lisa Lin [phone]   Currently in Pain? Yes    Pain Location Neck    Pain Orientation Right    Pain Descriptors / Indicators Tightness    Pain Type Chronic pain    Pain Onset More than a month ago    Pain Frequency Constant    Aggravating Factors  sleeping on back    Pain Relieving Factors heat                             [facility] Adult PT Treatment/Exercise - 10/06/20 0001      Shoulder Exercises: Supine   Horizontal ABduction AROM;Strengthening;Both;20 reps    Theraband Level (Shoulder Horizontal ABduction) Level 2 (Red)      Shoulder Exercises: Standing   External Rotation AROM;Strengthening;Both;20 reps    Theraband Level (Shoulder External Rotation) Level 2 (Red)     Extension AROM;Strengthening;Both;20 reps    Theraband Level (Shoulder Extension) Level 2 (Red)    Row AROM;Strengthening;Both;20 reps    Theraband Level (Shoulder Row) Level 2 (Red)      Manual Therapy   Manual Therapy Manual Traction    Soft tissue mobilization STM bilateral upper trapezius and levator region in sitting    Manual Traction cervical manual traction and suboccipital release      Neck Exercises: Stretches   Upper Trapezius Stretch Right;Left;3 reps;30 seconds    Levator Stretch Right;Left;3 reps;30 seconds    Chest Stretch --   supine manual pec minor stretch 2x30 sec           Trigger Point Dry Needling - 10/06/20 0001    Consent Given? Yes    Education Handout Provided Previously provided    Muscles Treated Head and Neck Upper trapezius;Levator scapulae   bilat.   Dry Needling Comments needling in prone with 32 gauge 30 mm needles    Upper Trapezius Response Twitch reponse elicited                  PT Short Term Goals - 09/03/20 1248      PT  SHORT TERM GOAL #1   Title STG=LTG             PT Long Term Goals - 09/03/20 1104      PT LONG TERM GOAL #1   Title Demonstrate and verbalize techniques to reduce the risk of re-injury including: lifting, posture, body mechanics for job cleaning homes    Baseline Pt sacral sitting with forward head and rounded shoulders    Time 6    Period Weeks    Status New    Target Date 10/15/20      PT LONG TERM GOAL #2   Title Pt will be independent with advanced HEP.    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    Baseline Unable to sweep or lift cleaning items for job    Time 6    Period Weeks    Status New    Target Date 10/15/20                 Plan - 10/06/20 1023    Clinical Impression Statement Continued previous tx. emphasis with dry needling along with manual therapy, stretches and postural strengthening. Still with trigger points/myofascial pain in right>left upper trapezius region but progressing with decreased pain intensity and improving positional tolerance.    Examination-Participation Restrictions Driving;Cleaning;Laundry    Stability/Clinical Decision Making Stable/Uncomplicated    Clinical Decision Making Low    Rehab Potential Excellent    PT Frequency 2x / week    PT Duration 6 weeks    PT Treatment/Interventions ADLs/Self Care Home Management;Cryotherapy;Electrical Stimulation;Iontophoresis 4mg /ml Dexamethasone;Moist Heat;Traction;Ultrasound;Therapeutic exercise;Therapeutic activities;Neuromuscular re-education;Patient/family education;Manual techniques;Passive range of motion;Dry needling;Taping;Spinal Manipulations;Joint Manipulations    PT Next Visit Plan scap stab and gross shoulder strnegthening, Pt would benefit from increasing UE strength to 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 continued DN PRN for upper trap/ levator scapulae R>L.    PT Home Exercise Plan [id number]    Consulted and Agree with Plan of Care Patient           Patient will benefit from skilled therapeutic intervention in order to improve the following deficits and  impairments:  Decreased range of motion, Decreased strength, Hypermobility, Increased muscle spasms, Impaired UE functional use, Postural dysfunction, Improper body mechanics, Pain  Visit Diagnosis: Cervicalgia  Acute pain of right shoulder  Abnormal posture  Muscle weakness (generalized)  Cramp  and spasm     Problem List Patient Active Problem List   Diagnosis Date Noted  . Hydronephrosis of right kidney 12/03/2018  . GERD (gastroesophageal reflux disease) 04/06/2017    Beaulah Dinning, PT, DPT 10/06/20 10:29 AM  [facility] [address] [place], [place], [address] Phone: [phone]   Fax:  [phone]  Name: Ashly Yepez MRN: [id number] Date of Birth: 1986/05/14

## 2020-10-08 ENCOUNTER — Encounter: Payer: Self-pay | Admitting: Internal Medicine

## 2020-10-08 ENCOUNTER — Ambulatory Visit: Payer: Self-pay | Attending: Family Medicine | Admitting: Internal Medicine

## 2020-10-08 ENCOUNTER — Other Ambulatory Visit: Payer: Self-pay

## 2020-10-08 VITALS — BP 96/66 | HR 68 | Temp 97.7°F | Ht 62.0 in | Wt 116.0 lb

## 2020-10-08 DIAGNOSIS — Z9229 Personal history of other drug therapy: Secondary | ICD-10-CM

## 2020-10-08 DIAGNOSIS — N644 Mastodynia: Secondary | ICD-10-CM

## 2020-10-08 DIAGNOSIS — F411 Generalized anxiety disorder: Secondary | ICD-10-CM

## 2020-10-08 DIAGNOSIS — Z0000 Encounter for general adult medical examination without abnormal findings: Secondary | ICD-10-CM

## 2020-10-08 DIAGNOSIS — Z1159 Encounter for screening for other viral diseases: Secondary | ICD-10-CM

## 2020-10-08 MED ORDER — BUSPIRONE HCL 5 MG PO TABS: 5.0000 mg | ORAL_TABLET | Freq: Two times a day (BID) | ORAL | 1 refills | Status: DC

## 2020-10-08 NOTE — Patient Instructions (Signed)
Trastorno de ansiedad generalizada, en adultos Generalized Anxiety Disorder, Adult El trastorno de ansiedad generalizada (TAG) es un trastorno de salud mental. Las personas con esta afeccin se preocupan constantemente por los eventos de [employer]. A diferencia de la ansiedad normal, la preocupacin relacionada con el TAG no se produce por un evento especfico. Estas preocupaciones tampoco desaparecen ni mejoran con el tiempo. EL TAG interfiere con las funciones de la vida, incluidas las [place], el trabajo y Cytogeneticist. El TAG puede variar de leve a grave. Las personas con TAG grave tienen intensas oleadas de ansiedad con sntomas fsicos (crisis de [place]). Cules son las causas? Se desconoce la causa exacta del TAG. Qu incrementa el riesgo? Es ms probable que esta afeccin se manifieste en:  Mujeres.  Las [employer] tienen antecedentes familiares de trastornos de [place].  Las personas que son Orlene Erm tmidas.  Las [employer] experimentan eventos muy estresantes en la vida, tales como la muerte de un ser querido.  Las personas que tienen un entorno Training and development officer. Cules son los signos o los sntomas? Con frecuencia, las personas que padecen el TAG se preocupan excesivamente por muchas cosas en la vida, tales como su salud y [place]. Tambin pueden experimentar una preocupacin desmedida por lo siguiente:  Hacer las [employer].  Llegar a tiempo.  Los [employer].  Las [employer]. Los sntomas fsicos del TAG incluyen:  Fatiga.  Tensin muscular o contracciones musculares.  Temblores o agitacin.  Sobresaltarse con facilidad.  Sentir que el corazn late fuerte o est acelerado.  Sentir falta de aire o como que no se puede respirar profundamente.  Problemas para quedarse dormido o para seguir durmiendo.  Sudoracin.  Nuseas, diarrea o sndrome del intestino irritable (SII).  Dolores de [place].  Dificultad para concentrarse o  recordar hechos.  Agitacin.  Irritabilidad. Cmo se diagnostica? El mdico puede diagnosticar el TAG en funcin de los sntomas y los antecedentes mdicos. Se le Chartered certified accountant examen fsico. El mdico le har preguntas especficas sobre sus sntomas, que incluyen qu tan graves son, cundo comenzaron y si Lucianne Lei y vienen. Tambin puede hacerle preguntas sobre el consumo de alcohol o drogas, incluidos los medicamentos recetados. Su mdico podr derivarlo a un especialista de salud mental para ms evaluaciones. Su mdico le Sales promotion account executive exhaustivo y puede hacer pruebas adicionales para descartar otras posibles causas de sus sntomas. Para recibir un diagnstico del TAG, una persona debe tener ansiedad que:  Est fuera de su control.  Afecte distintos aspectos de su vida, como el trabajo y [employer].  Cause angustia que le impida participar en sus actividades habituales.  Incluya al menos tres sntomas fsicos del TAG tales como inquietud, fatiga, dificultad para concentrarse, irritabilidad, tensin muscular o problemas para dormir. Antes de que su mdico pueda confirmar un diagnstico del TAG, estos sntomas deben estar presentes ms [employer] no lo estn y deben tener una duracin de seis meses o ms. Cmo se trata? Las siguientes terapias se usan generalmente para tratar el TAG:  Medicamentos. Por lo general, los medicamentos antidepresivos se recetan para un control diario a Barrister's clerk. Se pueden agregar medicamentos para la ansiedad en casos graves, especialmente cuando ocurren crisis de [place].  Psicoterapia (psicoanlisis). Determinados tipos de psicoterapia pueden ser tiles para tratar el TAG al brindar 72, educacin y orientacin. Geraldine opciones se incluyen las siguientes: ? Terapia cognitivo conductual (TCC). Las personas aprenden habilidades para sobrellevar situaciones y tcnicas para Human resources officer ansiedad.  Aprenden a identificar conductas y pensamientos  irreales o negativos, y a reemplazarlos por positivos. ? Terapia de aceptacin y compromiso (acceptance and commitment therapy, ACT). Este tratamiento ensea a las personas a ser conscientes como una forma de lidiar con pensamientos y sentimientos no deseados. ? Biorretroalimentacin. Este proceso lo capacita para Aeronautical engineer respuesta del cuerpo Insurance risk surveyor) a travs de tcnicas de respiracin y mtodos de relajacin. Usted trabajar con un terapeuta mientras se usan mquinas para controlar sus sntomas fsicos.  Tcnicas para Scientific laboratory technician. Estas incluyen yoga, meditacin y ejercicio. Un especialista en salud mental puede ayudar a determinar qu tratamiento es el mejor para usted. Algunas Water quality scientist con solo un tipo de [place]. Sin embargo, Producer, television/film/video requieren una combinacin de terapias. Siga estas instrucciones en su casa:  Tome los medicamentos de venta libre y los recetados solamente como se lo haya indicado el mdico.  Trate de mantener una rutina normal.  Trate de anticipar situaciones estresantes y permita un tiempo adicional para controlarlas.  Participe de cualquier tcnica para autocalmarse o controlar el estrs segn las indicaciones de su mdico.  No se castigue ante los retrocesos o por no Librarian, academic.  Trate de reconocer sus logros aunque sean pequeos.  Concurra a todas las visitas de seguimiento como se lo haya indicado el mdico. Esto es importante. Comunquese con un mdico si:  Los sntomas no mejoran.  Los sntomas empeoran.  Tiene signos de depresin tales como: ? Tristeza, mal humor o irritabilidad. ? Ya no disfruta de Engineering geologist. ? Cambios en el peso y o en sus hbitos de alimentacin. ? Cambios en los hbitos de sueo. ? Evita a amigos y familiares. ? No tiene energa para realizar las tareas habituales. ? Tiene sentimientos de culpa o de minusvala. Solicite ayuda de inmediato  si:  Tiene pensamientos serios acerca de lastimarse a usted mismo o daar a Producer, television/film/video. Si alguna vez siente que puede lastimarse o Market researcher a los dems, o piensa en poner fin a su vida, busque ayuda de inmediato. Puede dirigirse al servicio de urgencias ms cercano o comunicarse con:  El servicio de Multimedia programmer de su localidad (911 en los [place]).  Una lnea de asistencia al suicida y Freight forwarder en crisis, como la [employer] de Prevencin del Suicidio (National Suicide Prevention Lifeline) al [phone]. Est disponible las 24 horas del da. Resumen  El trastorno de ansiedad generalizada (TAG) es un trastorno de salud mental que implica preocupacin no causada por un evento especfico.  Con frecuencia, las personas que padecen el TAG se preocupan excesivamente por muchas cosas en la vida, tales como su salud y [place].  El TAG puede causar sntomas fsicos tales como inquietud, dificultad para concentrarse, problemas para dormir, sudoracin frecuente, nuseas, diarrea, dolores de [place] y temblores, o contracciones musculares.  Un especialista en salud mental puede ayudar a determinar qu tratamiento es el mejor para usted. Algunas Water quality scientist con solo un tipo de [place]. Sin embargo, Producer, television/film/video requieren una combinacin de terapias. Esta informacin no tiene Marine scientist el consejo del mdico. Asegrese de hacerle al mdico cualquier pregunta que tenga. Document Revised: 02/24/2017 Document Reviewed: 02/24/2017 Elsevier Patient Education  [place].

## 2020-10-08 NOTE — Progress Notes (Signed)
Patient ID: Lisa Lin, female    DOB: 1986-02-04  MRN: [id number]  CC: Annual Exam (Pt. is here for a physical exam.)   Subjective: Lisa Lin is a 34 y.o. female who presents for physical Her concerns today include:  Pt with hx of GERD.   c/o feeling stressed about her children's school.  Her children are back in the class room and she is happy about that.  Feels anxious a lot for the past 5 mths. Reports having anxiety attacks about once a wk - feels like she is unable to breath and have pressure on the chest. Not doing anything to help relieve stress.  Works 5 days a wk around 8 hrs.  In a relationship with a female partner.  No problems with their relationship. No feelings of depression.  Feels she would benefit from med to help decrease anxiety.  Feels she would not have time availlable to do counseling.  Therapy was helpful for her neck and shoulder pain.  Completed last wk  Pain in the nipples x 2 wks. No noted skin changes, nipple discharge.  Not breast feeding No fhx of breast cancer.  Patient Active Problem List   Diagnosis Date Noted  . Hydronephrosis of right kidney 12/03/2018  . GERD (gastroesophageal reflux disease) 04/06/2017     Current Outpatient Medications on File Prior to Visit  Medication Sig Dispense Refill  . ibuprofen (ADVIL) 600 MG tablet Take 1 tablet (600 mg total) by mouth every 8 (eight) hours as needed for moderate pain. (Patient not taking: Reported on 01/09/2020) 60 tablet 0   No current facility-administered medications on file prior to visit.    No Known Allergies  Social History   Socioeconomic History  . Marital status: Significant Other    Spouse name: Not on file  . Number of children: 3  . Years of education: Not on file  . Highest education level: Not on file  Occupational History  . Occupation: cleaning  Tobacco Use  . Smoking status: Never Smoker  . Smokeless tobacco: Never Used  Vaping Use  . Vaping  Use: Never used  Substance and Sexual Activity  . Alcohol use: No    Alcohol/week: 0.0 standard drinks  . Drug use: No  . Sexual activity: Yes    Birth control/protection: Injection  Other Topics Concern  . Not on file  Social History Narrative  . Not on file   Social Determinants of Health   Financial Resource Strain:   . Difficulty of Paying Living Expenses: Not on file  Food Insecurity:   . Worried About Charity fundraiser in the Last Year: Not on file  . Ran Out of Food in the Last Year: Not on file  Transportation Needs:   . Lack of Transportation (Medical): Not on file  . Lack of Transportation (Non-Medical): Not on file  Physical Activity:   . Days of Exercise per Week: Not on file  . Minutes of Exercise per Session: Not on file  Stress:   . Feeling of Stress : Not on file  Social Connections:   . Frequency of Communication with Friends and Family: Not on file  . Frequency of Social Gatherings with Friends and Family: Not on file  . Attends Religious Services: Not on file  . Active Member of Clubs or Organizations: Not on file  . Attends Archivist Meetings: Not on file  . Marital Status: Not on file  Intimate Partner Violence:   .  Fear of Current or Ex-Partner: Not on file  . Emotionally Abused: Not on file  . Physically Abused: Not on file  . Sexually Abused: Not on file    Family History  Problem Relation Age of Onset  . Diabetes Maternal Grandmother     Past Surgical History:  Procedure Laterality Date  . NO PAST SURGERIES      ROS: Review of Systems  Constitutional:       Run 20-30 mins  almost daily for exercise. Feels she is doing well with eating habits.  HENT: Positive for dental problem (seeing a dentist already) and hearing loss (hears buzzing in LT ear occasionally). Negative for congestion and sore throat.   Eyes: Negative for visual disturbance.  Respiratory: Negative for cough.   Gastrointestinal: Negative for blood in stool.        Moving bowels okay  Genitourinary: Negative for difficulty urinating.   PHYSICAL EXAM: BP 96/66 (BP Location: Left Arm, Patient Position: Sitting, Cuff Size: Normal)   Pulse 68   Temp 97.7 F (36.5 C) (Temporal)   Ht 5\' 2"  (1.575 m)   Wt 116 lb (52.6 kg)   SpO2 100%   BMI 21.22 kg/m   Wt Readings from Last 3 Encounters:  10/08/20 116 lb (52.6 kg)  12/27/19 118 lb (53.5 kg)  12/03/18 118 lb 3.2 oz (53.6 kg)    Physical Exam  General appearance - alert, well appearing, and in no distress Mental status - normal mood, behavior, speech, dress, motor activity, and thought processes Eyes - pupils equal and reactive, extraocular eye movements intact Ears - bilateral TM's and external ear canals normal Nose - normal and patent, no erythema, discharge or polyps Mouth - mucous membranes moist, pharynx normal without lesions Neck - supple, no significant adenopathy Lymphatics - no palpable lymphadenopathy, no hepatosplenomegaly Chest - clear to auscultation, no wheezes, rales or rhonchi, symmetric air entry Heart - normal rate, regular rhythm, normal S1, S2, no murmurs, rubs, clicks or gallops Abdomen - soft, nontender, nondistended, no masses or organomegaly Breasts -CMA Oretha Milch present: Breasts appear normal, no suspicious masses, no skin or nipple changes or axillary nodes.  No discgh expressed from nipples Pelvic:  Not due for pap Extremities - peripheral pulses normal, no pedal edema, no clubbing or cyanosis  Depression screen [facility] 2/9 10/08/2020 12/27/2019 12/03/2018  Decreased Interest 0 0 0  Down, Depressed, Hopeless 0 0 0  PHQ - 2 Score 0 0 0  Altered sleeping 0 0 -  Tired, decreased energy 2 0 -  Change in appetite 2 0 -  Feeling bad or failure about yourself  2 0 -  Trouble concentrating 0 0 -  Moving slowly or fidgety/restless 0 0 -  Suicidal thoughts 0 0 -  PHQ-9 Score 6 0 -   GAD 7 : Generalized Anxiety Score 10/08/2020 12/27/2019 12/03/2018 04/16/2018    Nervous, Anxious, on Edge 2 0 0 0  Control/stop worrying 0 0 0 0  Worry too much - different things 2 0 0 0  Trouble relaxing 2 0 0 0  Restless 2 0 0 0  Easily annoyed or irritable 2 0 0 0  Afraid - awful might happen 2 0 - 0  Total GAD 7 Score 12 0 - 0      CMP Latest Ref Rng & Units 10/08/2018 03/30/2017 03/29/2017  Glucose 65 - 99 mg/dL 92 89 98  BUN 6 - 20 mg/dL 9 8 5(L)  Creatinine 0.57 - 1.00 mg/dL  0.62 0.53 0.58  Sodium 134 - 144 mmol/L 142 136 134(L)  Potassium 3.5 - 5.2 mmol/L 3.6 3.9 3.6  Chloride 96 - 106 mmol/L 104 106 107  CO2 20 - 29 mmol/L 19(L) 22 21(L)  Calcium 8.7 - 10.2 mg/dL 9.5 8.9 8.5(L)  Total Protein 6.0 - 8.5 g/dL 7.3 - -  Total Bilirubin 0.0 - 1.2 mg/dL 0.3 - -  Alkaline Phos 39 - 117 IU/L 70 - -  AST 0 - 40 IU/L 15 - -  ALT 0 - 32 IU/L 13 - -   Lipid Panel     Component Value Date/Time   CHOL 119 12/12/2014 1155   TRIG 54 12/12/2014 1155   HDL 60 12/12/2014 1155   CHOLHDL 2.0 12/12/2014 1155   VLDL 11 12/12/2014 1155   LDLCALC 48 12/12/2014 1155    CBC    Component Value Date/Time   WBC 6.7 10/08/2018 1531   WBC 5.7 03/30/2017 0455   RBC 4.22 10/08/2018 1531   RBC 3.78 (L) 03/30/2017 0455   HGB 13.1 10/08/2018 1531   HCT 38.0 10/08/2018 1531   PLT 267 10/08/2018 1531   MCV 90 10/08/2018 1531   MCH 31.0 10/08/2018 1531   MCH 30.4 03/30/2017 0455   MCHC 34.5 10/08/2018 1531   MCHC 33.3 03/30/2017 0455   RDW 11.7 (L) 10/08/2018 1531   LYMPHSABS 1.9 10/08/2018 1531   MONOABS 0.8 03/27/2017 1325   EOSABS 0.1 10/08/2018 1531   BASOSABS 0.0 10/08/2018 1531    ASSESSMENT AND PLAN: 1. Annual physical exam Encourage her to continue healthy eating habits and regular exercise.  2. Generalized anxiety disorder We discussed management of anxiety with CBT and medication.  However patient feels she would not have time to attend counseling sessions giving her work schedule.  She is wanting and willing to try medication to help decrease anxiety  symptoms.  I spoke with her about BuSpar.  We will start her on a low dose. - busPIRone (BUSPAR) 5 MG tablet; Take 1 tablet (5 mg total) by mouth 2 (two) times daily.  Dispense: 60 tablet; Refill: 1 - CBC - Comprehensive metabolic panel  3. Nipple pain Etiology is unclear at this time.  However I have recommended that she use ibuprofen as needed for the next several days.  If it does not resolve within the next 2 to 3 weeks, I have asked her to follow-up.  I have opted not to do imaging studies at this time unless symptoms persist.  4. Need for hepatitis C screening test - Hepatitis C Antibody  5. COVID-19 vaccine series completed I asked that she bring a copy of her COVID-19 vaccine card with her on next visit so that we can update her health maintenance.     Patient was given the opportunity to ask questions.  Patient verbalized understanding of the plan and was able to repeat key elements of the plan.  Stratus interpreter used during this encounter. [id number]  Orders Placed This Encounter  Procedures  . Hepatitis C Antibody  . CBC  . Comprehensive metabolic panel     Requested Prescriptions   Signed Prescriptions Disp Refills  . busPIRone (BUSPAR) 5 MG tablet 60 tablet 1    Sig: Take 1 tablet (5 mg total) by mouth 2 (two) times daily.    Return in about 2 months (around 12/08/2020).  Karle Plumber, MD, FACP

## 2020-10-09 LAB — COMPREHENSIVE METABOLIC PANEL
ALT: 17 IU/L (ref 0–32)
AST: 16 IU/L (ref 0–40)
Albumin/Globulin Ratio: 1.5 (ref 1.2–2.2)
Albumin: 4.3 g/dL (ref 3.8–4.8)
Alkaline Phosphatase: 64 IU/L (ref 44–121)
BUN/Creatinine Ratio: 18 (ref 9–23)
BUN: 10 mg/dL (ref 6–20)
Bilirubin Total: 0.4 mg/dL (ref 0.0–1.2)
CO2: 23 mmol/L (ref 20–29)
Calcium: 9.1 mg/dL (ref 8.7–10.2)
Chloride: 105 mmol/L (ref 96–106)
Creatinine, Ser: 0.55 mg/dL — ABNORMAL LOW (ref 0.57–1.00)
GFR calc Af Amer: 144 mL/min/{1.73_m2} (ref >59)
GFR calc non Af Amer: 125 mL/min/{1.73_m2} (ref >59)
Globulin, Total: 2.8 g/dL (ref 1.5–4.5)
Glucose: 84 mg/dL (ref 65–99)
Potassium: 4.2 mmol/L (ref 3.5–5.2)
Sodium: 141 mmol/L (ref 134–144)
Total Protein: 7.1 g/dL (ref 6.0–8.5)

## 2020-10-09 LAB — CBC
Hematocrit: 38.3 % (ref 34.0–46.6)
Hemoglobin: 12.9 g/dL (ref 11.1–15.9)
MCH: 30.3 pg (ref 26.6–33.0)
MCHC: 33.7 g/dL (ref 31.5–35.7)
MCV: 90 fL (ref 79–97)
Platelets: 244 10*3/uL (ref 150–450)
RBC: 4.26 x10E6/uL (ref 3.77–5.28)
RDW: 11.2 % — ABNORMAL LOW (ref 11.7–15.4)
WBC: 4.1 10*3/uL (ref 3.4–10.8)

## 2020-10-09 LAB — HEPATITIS C ANTIBODY: Hep C Virus Ab: <0.1 s/co ratio (ref 0.0–0.9)

## 2020-11-13 NOTE — Therapy (Signed)
[facility] [address] [place], [place], [address] Phone: [phone]   Fax:  [phone]  Physical Therapy Treatment/Discharge  Patient Details  [name] MRN: [id number] Date of Birth: 08/16/1986 Referring Provider (PT): Johnson, Deborah MD   Encounter Date: 10/06/2020    Past Medical History:  Diagnosis Date  . Colitis   . Gastritis   . GERD (gastroesophageal reflux disease)   . Pyelonephritis   . Pyelonephritis     Past Surgical History:  Procedure Laterality Date  . NO PAST SURGERIES      There were no vitals filed for this visit.                                PT Short Term Goals - 09/03/20 1248      PT SHORT TERM GOAL #1   Title STG=LTG             PT Long Term Goals - 09/03/20 1104      PT LONG TERM GOAL #1   Title Demonstrate and verbalize techniques to reduce the risk of re-injury including: lifting, posture, body mechanics for job cleaning homes    Baseline Pt sacral sitting with forward head and rounded shoulders    Time 6    Period Weeks    Status New    Target Date 10/15/20      PT LONG TERM GOAL #2   Title Pt will be independent with advanced HEP.    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    Baseline Unable to sweep or lift cleaning items for job    Time 6    Period Weeks    Status New    Target Date 10/15/20                  Patient will benefit from skilled therapeutic intervention in order to improve the following deficits and impairments:  Decreased range of motion,Decreased strength,Hypermobility,Increased muscle spasms,Impaired UE functional use,Postural dysfunction,Improper body mechanics,Pain  Visit Diagnosis: Cervicalgia  Acute pain of right shoulder  Abnormal posture  Muscle weakness (generalized)  Cramp and spasm     Problem List Patient Active Problem List   Diagnosis Date Noted  . Generalized anxiety disorder 10/08/2020  . COVID-19 vaccine series completed 10/08/2020  .   Hydronephrosis of right kidney 12/03/2018  . GERD (gastroesophageal reflux disease) 04/06/2017      PHYSICAL THERAPY DISCHARGE SUMMARY  Visits from Start of Care: 5  Current functional level related to goals / functional outcomes: Patient did not return for further therapy after last session 10/06/20. No further visits scheduled at this time-as of last visit patient had been noting improvement-recommend continue with HEP and follow up with MD with any changes in status or need for further formal therapy visits.   Remaining deficits: Postural tension   Education / Equipment: HEP Plan: Patient agrees to discharge.  Patient goals were partially met. Patient is being discharged due to meeting the stated rehab goals.  ?????          Christopher Zoch, PT, DPT 11/13/20 8:33 AM     [facility] [address] [place], [place], [address] Phone: [phone]   Fax:  [phone]  [name] MRN: [id number] Date of Birth:  05/09/1986   

## 2020-11-18 ENCOUNTER — Ambulatory Visit: Payer: Self-pay | Attending: Internal Medicine | Admitting: *Deleted

## 2020-11-18 ENCOUNTER — Other Ambulatory Visit: Payer: Self-pay

## 2020-11-18 DIAGNOSIS — Z3042 Encounter for surveillance of injectable contraceptive: Secondary | ICD-10-CM

## 2020-11-18 MED ORDER — MEDROXYPROGESTERONE ACETATE 150 MG/ML IM SUSP
150.0000 mg | Freq: Once | INTRAMUSCULAR | Status: AC
  Administered 2020-11-18: 10:00:00 150 mg via INTRAMUSCULAR

## 2020-11-18 NOTE — Progress Notes (Signed)
Date last pap: Apr 16, 2018 Last Depo-Provera: unknown. Patient states last depo was in October. A urine HCG was obtained today.  Side Effects if any: n/a Serum HCG indicated? Yes. Patient states last Depo was in October. No record of an October injection.  Depo-Provera 150 mg IM given by: T.Jamilex Bohnsack,RN. No adverse reactions. Patient tolerated injection well.  Next appointment due March 1st- 16th.  Michelene Heady with [employer] language services ID number [phone]

## 2020-12-11 ENCOUNTER — Encounter: Payer: Self-pay | Admitting: Internal Medicine

## 2020-12-11 ENCOUNTER — Other Ambulatory Visit: Payer: Self-pay

## 2020-12-11 ENCOUNTER — Other Ambulatory Visit: Payer: Self-pay | Admitting: Internal Medicine

## 2020-12-11 ENCOUNTER — Ambulatory Visit: Payer: Self-pay | Attending: Internal Medicine | Admitting: Internal Medicine

## 2020-12-11 VITALS — BP 112/76 | HR 83 | Temp 98.4°F | Resp 16 | Wt 115.0 lb

## 2020-12-11 DIAGNOSIS — F411 Generalized anxiety disorder: Secondary | ICD-10-CM

## 2020-12-11 DIAGNOSIS — M542 Cervicalgia: Secondary | ICD-10-CM

## 2020-12-11 DIAGNOSIS — G8929 Other chronic pain: Secondary | ICD-10-CM

## 2020-12-11 MED ORDER — METHOCARBAMOL 500 MG PO TABS: 500.0000 mg | ORAL_TABLET | Freq: Two times a day (BID) | ORAL | 1 refills | Status: DC | PRN

## 2020-12-11 MED ORDER — IBUPROFEN 600 MG PO TABS
600.0000 mg | ORAL_TABLET | Freq: Three times a day (TID) | ORAL | 0 refills | Status: DC | PRN
Start: 2020-12-11 — End: 2021-01-28

## 2020-12-11 MED ORDER — BUSPIRONE HCL 10 MG PO TABS: 10.0000 mg | ORAL_TABLET | Freq: Two times a day (BID) | ORAL | 2 refills | Status: DC

## 2020-12-11 NOTE — Progress Notes (Signed)
Patient ID: Lisa Lin, female    DOB: [birth year]  MRN: [id number]  CC: Follow-up (2 month )   Subjective: Lisa Lin is a 35 y.o. female who presents for 2 mth f/u anxiety Her concerns today include:  Pt with hx of GERD, GAD  Pt seen 2 mths ago with anxiety symptoms.  Dx with anxiety disorder.  Started on Buspar 5 mg BID.  Reports med helps a little but overall feels the same.  Life stresses about the same. When asked what she has been doing to help deal with stress, she states she has been talking "to some people", exercising and doing cosmotology class.    C/o RT side neck pain for several mths Constant. Rates 8/10. Sometimes goes to LT side of neck.  No radiation down arms.  She feels that her right hand is a little weak. Feels stress makes it worse. She had seen Ortho PA for this back in February of last year.  She was doing P.T end of last year for same.  It has helped and she does practice exercises shown to her   Patient Active Problem List   Diagnosis Date Noted  . Generalized anxiety disorder 10/08/2020  . COVID-19 vaccine series completed 10/08/2020  . Hydronephrosis of right kidney 12/03/2018  . GERD (gastroesophageal reflux disease) 04/06/2017     Current Outpatient Medications on File Prior to Visit  Medication Sig Dispense Refill  . busPIRone (BUSPAR) 5 MG tablet Take 1 tablet (5 mg total) by mouth 2 (two) times daily. 60 tablet 1  . ibuprofen (ADVIL) 600 MG tablet Take 1 tablet (600 mg total) by mouth every 8 (eight) hours as needed for moderate pain. (Patient not taking: Reported on 01/09/2020) 60 tablet 0   No current facility-administered medications on file prior to visit.    No Known Allergies  Social History   Socioeconomic History  . Marital status: Significant Other    Spouse name: Not on file  . Number of children: 3  . Years of education: Not on file  . Highest education level: Not on file  Occupational History  .  Occupation: cleaning  Tobacco Use  . Smoking status: Never Smoker  . Smokeless tobacco: Never Used  Vaping Use  . Vaping Use: Never used  Substance and Sexual Activity  . Alcohol use: No    Alcohol/week: 0.0 standard drinks  . Drug use: No  . Sexual activity: Yes    Birth control/protection: Injection  Other Topics Concern  . Not on file  Social History Narrative  . Not on file   Social Determinants of Health   Financial Resource Strain: Not on file  Food Insecurity: Not on file  Transportation Needs: Not on file  Physical Activity: Not on file  Stress: Not on file  Social Connections: Not on file  Intimate Partner Violence: Not on file    Family History  Problem Relation Age of Onset  . Diabetes Maternal Grandmother     Past Surgical History:  Procedure Laterality Date  . NO PAST SURGERIES      ROS: Review of Systems Negative except as stated above  PHYSICAL EXAM: BP 112/76   Pulse 83   Temp 98.4 F (36.9 C)   Resp 16   Wt 115 lb (52.2 kg)   SpO2 100%   BMI 21.03 kg/m   Physical Exam  General appearance - alert, well appearing, and in no distress Mental status -flat affect.  She  nervously clicks or fingers.   Neurological -grip 5/5 bilaterally.  Power in both upper extremities 5/5 bilaterally.  She has subjective decreased sensation to gross touch   musculoskeletal -neck: Mild tenderness on palpation of the trapezius muscle on the right side and over the right shoulder blade.  She has good range of motion of the right shoulder joint.  Good passive range of motion of the neck.    GAD 7 : Generalized Anxiety Score 10/08/2020 12/27/2019 12/03/2018 04/16/2018  Nervous, Anxious, on Edge 2 0 0 0  Control/stop worrying 0 0 0 0  Worry too much - different things 2 0 0 0  Trouble relaxing 2 0 0 0  Restless 2 0 0 0  Easily annoyed or irritable 2 0 0 0  Afraid - awful might happen 2 0 - 0  Total GAD 7 Score 12 0 - 0    Depression screen [facility] 2/9 12/11/2020  10/08/2020 12/27/2019  Decreased Interest 1 0 0  Down, Depressed, Hopeless 1 0 0  PHQ - 2 Score 2 0 0  Altered sleeping 1 0 0  Tired, decreased energy 1 2 0  Change in appetite 1 2 0  Feeling bad or failure about yourself  0 2 0  Trouble concentrating 1 0 0  Moving slowly or fidgety/restless 1 0 0  Suicidal thoughts 0 0 0  PHQ-9 Score 7 6 0    CMP Latest Ref Rng & Units 10/08/2020 10/08/2018 03/30/2017  Glucose 65 - 99 mg/dL 84 92 89  BUN 6 - 20 mg/dL 10 9 8   Creatinine 0.57 - 1.00 mg/dL 0.55(L) 0.62 0.53  Sodium 134 - 144 mmol/L 141 142 136  Potassium 3.5 - 5.2 mmol/L 4.2 3.6 3.9  Chloride 96 - 106 mmol/L 105 104 106  CO2 20 - 29 mmol/L 23 19(L) 22  Calcium 8.7 - 10.2 mg/dL 9.1 9.5 8.9  Total Protein 6.0 - 8.5 g/dL 7.1 7.3 -  Total Bilirubin 0.0 - 1.2 mg/dL 0.4 0.3 -  Alkaline Phos 44 - 121 IU/L 64 70 -  AST 0 - 40 IU/L 16 15 -  ALT 0 - 32 IU/L 17 13 -   Lipid Panel     Component Value Date/Time   CHOL 119 12/12/2014 1155   TRIG 54 12/12/2014 1155   HDL 60 12/12/2014 1155   CHOLHDL 2.0 12/12/2014 1155   VLDL 11 12/12/2014 1155   LDLCALC 48 12/12/2014 1155    CBC    Component Value Date/Time   WBC 4.1 10/08/2020 1135   WBC 5.7 03/30/2017 0455   RBC 4.26 10/08/2020 1135   RBC 3.78 (L) 03/30/2017 0455   HGB 12.9 10/08/2020 1135   HCT 38.3 10/08/2020 1135   PLT 244 10/08/2020 1135   MCV 90 10/08/2020 1135   MCH 30.3 10/08/2020 1135   MCH 30.4 03/30/2017 0455   MCHC 33.7 10/08/2020 1135   MCHC 33.3 03/30/2017 0455   RDW 11.2 (L) 10/08/2020 1135   LYMPHSABS 1.9 10/08/2018 1531   MONOABS 0.8 03/27/2017 1325   EOSABS 0.1 10/08/2018 1531   BASOSABS 0.0 10/08/2018 1531    ASSESSMENT AND PLAN: 1. Generalized anxiety disorder Discussed management of anxiety with cognitive behavioral therapy and medication.  Discussed either increasing the dose of BuSpar for better effect versus changing to an SSRI.  She is agreeable to increasing the dose of the BuSpar.  She is also  agreeable today to having an appointment with our LCSW for some counseling/CBT.  Recommend meditation  and deep breathing exercises when she feels tense or anxious.  I did 1 minute deep breathing exercise with her today. - busPIRone (BUSPAR) 10 MG tablet; Take 1 tablet (10 mg total) by mouth 2 (two) times daily.  Dispense: 60 tablet; Refill: 2  2. Chronic neck pain Recommend some muscle relaxant to use as needed.  Warned that the medication can cause drowsiness.  We will also have her take ibuprofen as needed.  If no improvement she may need an MRI. - ibuprofen (ADVIL) 600 MG tablet; Take 1 tablet (600 mg total) by mouth every 8 (eight) hours as needed for moderate pain.  Dispense: 60 tablet; Refill: 0 - methocarbamol (ROBAXIN) 500 MG tablet; Take 1 tablet (500 mg total) by mouth 2 (two) times daily as needed for muscle spasms.  Dispense: 30 tablet; Refill: 1   Patient was given the opportunity to ask questions.  Patient verbalized understanding of the plan and was able to repeat key elements of the plan.  [employer] Language interpreter used during this encounter.  Mikisli #[id number]  No orders of the defined types were placed in this encounter.    Requested Prescriptions    No prescriptions requested or ordered in this encounter    No follow-ups on file.  Karle Plumber, MD, FACP

## 2020-12-11 NOTE — Patient Instructions (Signed)
Trastorno de ansiedad generalizada, en adultos Generalized Anxiety Disorder, Adult El trastorno de ansiedad generalizada (TAG) es un trastorno de salud mental. Las personas con esta afeccin se preocupan constantemente por los eventos de todos los das. A diferencia de la ansiedad normal, la preocupacin relacionada con el TAG no se produce por un evento especfico. Estas preocupaciones tampoco desaparecen ni mejoran con el tiempo. EL TAG interfiere con las funciones de la vida, incluidas las relaciones, el trabajo y la escuela. El TAG puede variar de leve a grave. Las personas con TAG grave tienen intensas oleadas de ansiedad con sntomas fsicos (crisis de angustia). Cules son las causas? Se desconoce la causa exacta del TAG. Qu incrementa el riesgo? Es ms probable que esta afeccin se manifieste en:  Mujeres.  Las personas que tienen antecedentes familiares de trastornos de ansiedad.  Las personas que son muy tmidas.  Las personas que experimentan eventos muy estresantes en la vida, tales como la muerte de un ser querido.  Las personas que tienen un entorno familiar muy estresante. Cules son los signos o los sntomas? Con frecuencia, las personas que padecen el TAG se preocupan excesivamente por muchas cosas en la vida, tales como su salud y su familia. Tambin pueden experimentar una preocupacin desmedida por lo siguiente:  Hacer las cosas bien.  Llegar a tiempo.  Los desastres naturales.  Las amistades. Los sntomas fsicos del TAG incluyen:  Fatiga.  Tensin muscular o contracciones musculares.  Temblores o agitacin.  Sobresaltarse con facilidad.  Sentir que el corazn late fuerte o est acelerado.  Sentir falta de aire o como que no se puede respirar profundamente.  Problemas para quedarse dormido o para seguir durmiendo.  Sudoracin.  Nuseas, diarrea o sndrome del intestino irritable (SII).  Dolores de cabeza.  Dificultad para concentrarse o  recordar hechos.  Agitacin.  Irritabilidad. Cmo se diagnostica? El mdico puede diagnosticar el TAG en funcin de los sntomas y los antecedentes mdicos. Se le realizar un examen fsico. El mdico le har preguntas especficas sobre sus sntomas, que incluyen qu tan graves son, cundo comenzaron y si van y vienen. Tambin puede hacerle preguntas sobre el consumo de alcohol o drogas, incluidos los medicamentos recetados. Su mdico podr derivarlo a un especialista de salud mental para ms evaluaciones. Su mdico le realizar un examen exhaustivo y puede hacer pruebas adicionales para descartar otras posibles causas de sus sntomas. Para recibir un diagnstico del TAG, una persona debe tener ansiedad que:  Est fuera de su control.  Afecte distintos aspectos de su vida, como el trabajo y las relaciones.  Cause angustia que le impida participar en sus actividades habituales.  Incluya al menos tres sntomas fsicos del TAG tales como inquietud, fatiga, dificultad para concentrarse, irritabilidad, tensin muscular o problemas para dormir. Antes de que su mdico pueda confirmar un diagnstico del TAG, estos sntomas deben estar presentes ms das de los que no lo estn y deben tener una duracin de seis meses o ms. Cmo se trata? Las siguientes terapias se usan generalmente para tratar el TAG:  Medicamentos. Por lo general, los medicamentos antidepresivos se recetan para un control diario a largo plazo. Se pueden agregar medicamentos para la ansiedad en casos graves, especialmente cuando ocurren crisis de angustia.  Psicoterapia (psicoanlisis). Determinados tipos de psicoterapia pueden ser tiles para tratar el TAG al brindar apoyo, educacin y orientacin. Entre las opciones se incluyen las siguientes: ? Terapia cognitivo conductual (TCC). Las personas aprenden habilidades para sobrellevar situaciones y tcnicas para aliviar la ansiedad.   Aprenden a identificar conductas y pensamientos  irreales o negativos, y a reemplazarlos por positivos. ? Terapia de aceptacin y compromiso (acceptance and commitment therapy, ACT). Este tratamiento ensea a las personas a ser conscientes como una forma de lidiar con pensamientos y sentimientos no deseados. ? Biorretroalimentacin. Este proceso lo capacita para controlar la respuesta del cuerpo (respuesta psicolgica) a travs de tcnicas de respiracin y mtodos de relajacin. Usted trabajar con un terapeuta mientras se usan mquinas para controlar sus sntomas fsicos.  Tcnicas para controlar el estrs. Estas incluyen yoga, meditacin y ejercicio. Un especialista en salud mental puede ayudar a determinar qu tratamiento es el mejor para usted. Algunas personas pueden mejorar con solo un tipo de terapia. Sin embargo, otras personas requieren una combinacin de terapias. Siga estas instrucciones en su casa:  Tome los medicamentos de venta libre y los recetados solamente como se lo haya indicado el mdico.  Trate de mantener una rutina normal.  Trate de anticipar situaciones estresantes y permita un tiempo adicional para controlarlas.  Participe de cualquier tcnica para autocalmarse o controlar el estrs segn las indicaciones de su mdico.  No se castigue ante los retrocesos o por no realizar progresos.  Trate de reconocer sus logros aunque sean pequeos.  Concurra a todas las visitas de seguimiento como se lo haya indicado el mdico. Esto es importante. Comunquese con un mdico si:  Los sntomas no mejoran.  Los sntomas empeoran.  Tiene signos de depresin tales como: ? Tristeza, mal humor o irritabilidad. ? Ya no disfruta de actividades que le solan causar placer. ? Cambios en el peso y o en sus hbitos de alimentacin. ? Cambios en los hbitos de sueo. ? Evita a amigos y familiares. ? No tiene energa para realizar las tareas habituales. ? Tiene sentimientos de culpa o de minusvala. Solicite ayuda de inmediato  si:  Tiene pensamientos serios acerca de lastimarse a usted mismo o daar a otras personas. Si alguna vez siente que puede lastimarse o lastimar a los dems, o piensa en poner fin a su vida, busque ayuda de inmediato. Puede dirigirse al servicio de urgencias ms cercano o comunicarse con:  El servicio de emergencias de su localidad (911 en los [place]).  Una lnea de asistencia al suicida y atencin en crisis, como la Lnea Nacional de Prevencin del Suicidio (National Suicide Prevention Lifeline) al 1-800-273-8255. Est disponible las 24 horas del da. Resumen  El trastorno de ansiedad generalizada (TAG) es un trastorno de salud mental que implica preocupacin no causada por un evento especfico.  Con frecuencia, las personas que padecen el TAG se preocupan excesivamente por muchas cosas en la vida, tales como su salud y su familia.  El TAG puede causar sntomas fsicos tales como inquietud, dificultad para concentrarse, problemas para dormir, sudoracin frecuente, nuseas, diarrea, dolores de cabeza y temblores, o contracciones musculares.  Un especialista en salud mental puede ayudar a determinar qu tratamiento es el mejor para usted. Algunas personas pueden mejorar con solo un tipo de terapia. Sin embargo, otras personas requieren una combinacin de terapias. Esta informacin no tiene como fin reemplazar el consejo del mdico. Asegrese de hacerle al mdico cualquier pregunta que tenga. Document Revised: 02/24/2017 Document Reviewed: 02/24/2017 Elsevier Patient Education  2020 Elsevier Inc.  

## 2020-12-28 ENCOUNTER — Institutional Professional Consult (permissible substitution): Payer: Self-pay | Admitting: Licensed Clinical Social Worker

## 2021-01-07 ENCOUNTER — Other Ambulatory Visit: Payer: Self-pay | Admitting: Family Medicine

## 2021-01-07 DIAGNOSIS — K219 Gastro-esophageal reflux disease without esophagitis: Secondary | ICD-10-CM

## 2021-01-28 ENCOUNTER — Other Ambulatory Visit: Payer: Self-pay

## 2021-01-28 ENCOUNTER — Other Ambulatory Visit: Payer: Self-pay | Admitting: Internal Medicine

## 2021-01-28 ENCOUNTER — Ambulatory Visit: Payer: Self-pay | Attending: Internal Medicine | Admitting: Internal Medicine

## 2021-01-28 DIAGNOSIS — F411 Generalized anxiety disorder: Secondary | ICD-10-CM

## 2021-01-28 DIAGNOSIS — K219 Gastro-esophageal reflux disease without esophagitis: Secondary | ICD-10-CM

## 2021-01-28 DIAGNOSIS — M542 Cervicalgia: Secondary | ICD-10-CM

## 2021-01-28 DIAGNOSIS — G8929 Other chronic pain: Secondary | ICD-10-CM

## 2021-01-28 MED ORDER — OMEPRAZOLE 20 MG PO CPDR: 20.0000 mg | DELAYED_RELEASE_CAPSULE | Freq: Every day | ORAL | 3 refills | Status: AC

## 2021-01-28 MED ORDER — METHOCARBAMOL 500 MG PO TABS: 500.0000 mg | ORAL_TABLET | Freq: Two times a day (BID) | ORAL | 1 refills | Status: DC | PRN

## 2021-01-28 MED ORDER — IBUPROFEN 600 MG PO TABS
600.0000 mg | ORAL_TABLET | Freq: Three times a day (TID) | ORAL | 1 refills | Status: DC | PRN
Start: 2021-01-28 — End: 2021-06-28

## 2021-01-28 MED ORDER — BUSPIRONE HCL 10 MG PO TABS: 10.0000 mg | ORAL_TABLET | Freq: Two times a day (BID) | ORAL | 4 refills | Status: DC

## 2021-01-28 NOTE — Progress Notes (Signed)
Virtual Visit via Telephone Note  I connected with Lisa Lin on 01/28/21 at 8:35 a.m by telephone and verified that I am speaking with the correct person using two identifiers.  Location: Patient: home Provider: office  The pt, myself and Letta Moynahan [phone]) from [employer] used I discussed the limitations, risks, security and privacy concerns of performing an evaluation and management service by telephone and the availability of in person appointments. I also discussed with the patient that there may be a patient responsible charge related to this service. The patient expressed understanding and agreed to proceed.   History of Present Illness: Pt with hx of GERD, GAD, chronic RT sided neck pain. This is 6 wk f/u.  On last visit we increase Buspar to 10 mg BID.  She reports she is doing much better on the higher dose.  Reports neck pain also better with use of Ibuprofen and Robaxin. Request RFs.  Also requesting Omeprazole which she takes she was on in the past.  Gets heartburn symptoms about 3 x a wk with certain foods especially spicy foods.  She has not noticed an increase in heartburn symptoms with Ibuprofen.  Outpatient Encounter Medications as of 01/28/2021  Medication Sig  . busPIRone (BUSPAR) 10 MG tablet Take 1 tablet (10 mg total) by mouth 2 (two) times daily.  Marland Kitchen ibuprofen (ADVIL) 600 MG tablet Take 1 tablet (600 mg total) by mouth every 8 (eight) hours as needed for moderate pain.  . methocarbamol (ROBAXIN) 500 MG tablet Take 1 tablet (500 mg total) by mouth 2 (two) times daily as needed for muscle spasms.   No facility-administered encounter medications on file as of 01/28/2021.      Observations/Objective: No direct observation done as this was a telephone visit.  Assessment and Plan:  1. Generalized anxiety disorder Clinically improved.  Continue BuSpar.  2. Chronic neck pain Continue Robaxin.  I recommend changing the ibuprofen to Tylenol  given her heartburn symptoms.  However patient declined stating that she would prefer the ibuprofen.  Advised to take with meals. - methocarbamol (ROBAXIN) 500 MG tablet; Take 1 tablet (500 mg total) by mouth 2 (two) times daily as needed for muscle spasms.  Dispense: 30 tablet; Refill: 1 - ibuprofen (ADVIL) 600 MG tablet; Take 1 tablet (600 mg total) by mouth every 8 (eight) hours as needed for moderate pain.  Dispense: 60 tablet; Refill: 1  3. Gastroesophageal reflux disease without esophagitis GERD precautions discussed on encourage.  Patient advised that ibuprofen can cause increase in heartburn.  Advised to avoid certain foods that can worsen heartburn to include tomato-based foods, spicy foods and juices.  Advised to eat her last meal at least about 2 to 3 hours before laying down at nights and sleeping with her head slightly elevated. - omeprazole (PRILOSEC) 20 MG capsule; Take 1 capsule (20 mg total) by mouth daily.  Dispense: 30 capsule; Refill: 3   Follow Up Instructions: 5 mths   I discussed the assessment and treatment plan with the patient. The patient was provided an opportunity to ask questions and all were answered. The patient agreed with the plan and demonstrated an understanding of the instructions.   The patient was advised to call back or seek an in-person evaluation if the symptoms worsen or if the condition fails to improve as anticipated.  I provided 13 minutes of non-face-to-face time during this encounter.   Karle Plumber, MD

## 2021-02-03 ENCOUNTER — Encounter: Payer: Self-pay | Admitting: Internal Medicine

## 2021-02-03 ENCOUNTER — Ambulatory Visit: Payer: Self-pay | Attending: Internal Medicine

## 2021-02-03 ENCOUNTER — Other Ambulatory Visit: Payer: Self-pay

## 2021-02-03 ENCOUNTER — Ambulatory Visit: Payer: Self-pay

## 2021-02-03 DIAGNOSIS — Z3042 Encounter for surveillance of injectable contraceptive: Secondary | ICD-10-CM

## 2021-02-03 MED ORDER — MEDROXYPROGESTERONE ACETATE 150 MG/ML IM SUSP
150.0000 mg | Freq: Once | INTRAMUSCULAR | Status: AC
  Administered 2021-02-03: 150 mg via INTRAMUSCULAR

## 2021-02-03 NOTE — Progress Notes (Signed)
Pt came into the office for depo injection

## 2021-02-16 ENCOUNTER — Other Ambulatory Visit: Payer: Self-pay

## 2021-02-16 ENCOUNTER — Ambulatory Visit: Payer: Self-pay | Attending: Internal Medicine

## 2021-03-17 ENCOUNTER — Telehealth: Payer: Self-pay | Admitting: Internal Medicine

## 2021-03-17 NOTE — Telephone Encounter (Signed)
I called the Pt to inform that the appt is Virtual not in person since the PCP won't be in the office

## 2021-03-18 ENCOUNTER — Encounter: Payer: Self-pay | Admitting: Physician Assistant

## 2021-03-18 ENCOUNTER — Ambulatory Visit: Payer: Self-pay | Attending: Physician Assistant | Admitting: Physician Assistant

## 2021-03-18 ENCOUNTER — Other Ambulatory Visit: Payer: Self-pay

## 2021-03-18 DIAGNOSIS — G5621 Lesion of ulnar nerve, right upper limb: Secondary | ICD-10-CM

## 2021-03-18 NOTE — Progress Notes (Signed)
Virtual Visit via Telephone Note  I connected with [facility] Lisa Lin on 03/18/21 at  3:10 PM EDT by telephone and verified that I am speaking with the correct person using two identifiers.  Location: Patient: home Provider: [facility] office [employer] with [employer] interpre[employer]    I discussed the limitations, risks, security and privacy concerns of performing an evaluation and management service by telephone and the availability of in person appointments. I also discussed with the patient that there may be a patient responsible charge related to this service. The patient expressed understanding and agreed to proceed.   History of Present Illness:  Pain in R arm since about February, numbness on pointer finger and thumb.  Worse at night.  NKI.      Observations/Objective:  NAD.  A&Ox3   Assessment and Plan: 1. Ulnar neuropathy of right upper extremity - Ambulatory referral to Orthopedic Surgery -discussed ACE wrap around elbow to keep from extreme flexion. Ice, and NSAIDS as needed    Follow Up Instructions: See PCP prn   I discussed the assessment and treatment plan with the patient. The patient was provided an opportunity to ask questions and all were answered. The patient agreed with the plan and demonstrated an understanding of the instructions.   The patient was advised to call back or seek an in-person evaluation if the symptoms worsen or if the condition fails to improve as anticipated.  I provided 14 minutes of non-face-to-face time during this encounter.   Freeman Caldron, PA-C  Patient ID: Lisa Lin Lisa Lin, female   DOB: 1986/04/25, 35 y.o.   MRN: [id number]

## 2021-03-25 ENCOUNTER — Ambulatory Visit: Payer: Self-pay | Admitting: Orthopaedic Surgery

## 2021-03-29 ENCOUNTER — Ambulatory Visit (INDEPENDENT_AMBULATORY_CARE_PROVIDER_SITE_OTHER): Payer: Self-pay | Admitting: Physician Assistant

## 2021-03-29 ENCOUNTER — Ambulatory Visit (INDEPENDENT_AMBULATORY_CARE_PROVIDER_SITE_OTHER): Payer: Self-pay

## 2021-03-29 ENCOUNTER — Encounter: Payer: Self-pay | Admitting: Physician Assistant

## 2021-03-29 DIAGNOSIS — M25511 Pain in right shoulder: Secondary | ICD-10-CM

## 2021-03-29 DIAGNOSIS — M542 Cervicalgia: Secondary | ICD-10-CM

## 2021-03-29 NOTE — Progress Notes (Signed)
Office Visit Note   Patient: Katrianna Friesenhahn           Date of Birth: 1986-01-28           MRN: [id number] Visit Date: 03/29/2021              Requested by: Argentina Donovan, PA-C [place],   [id number] PCP: Ladell Pier, MD   Assessment & Plan: Visit Diagnoses:  1. Acute pain of right shoulder   2. Cervicalgia     Plan: With the aid of the interpreter discussed with her that her history and exam are at odds with 1 another.  She describes more of a carpal tunnel like symptoms but her exam points more to his cervical radiculopathy.  Unsure if things are being lost in translation.  Therefore we will order EMG nerve conduction studies rule out carpal tunnel versus cervical radiculopathy as the source of her pain down the right arm and have her back once the studies are available.  Questions were encouraged and answered at length.  Follow-Up Instructions: No follow-ups on file.   Orders:  Orders Placed This Encounter  Procedures  . XR Cervical Spine 2 or 3 views  . XR Shoulder Right  . Ambulatory referral to Physical Medicine Rehab   No orders of the defined types were placed in this encounter.     Procedures: No procedures performed   Clinical Data: No additional findings.   Subjective: Chief Complaint  Patient presents with  . Neck - Pain  . Right Shoulder - Pain    HPI Jola is a 35 year old female were seen for right arm pain.  Saw her last year for right arm pain that was radiating into the fourth and fifth fingers conservative management she got better.  She comes in today stating that her pain is changed.  Pains are about the right shoulder girdle but also radiates down the right arm into the thumb index and long finger .  She states that she has to shake her hand especially at night and in the morning to wake the hand up.  She denies any numbness tingling fourth and fifth fingers.  She does note that she used an Ace bandage  about the elbow to keep it from extreme flexion and this helps some.  She does note some neck pain.  She also notes that she owns an object like a phone in her right hand but the hand is good.  No known injury.  She notes that this all began this February.  She is accompanied by an interpreter today.  Review of Systems Negative for fevers or chills.  Please see HPI otherwise negative or noncontributory.  Objective: Vital Signs: There were no vitals taken for this visit.  Physical Exam General well-developed well-nourished female no acute distress.  Mood and affect appropriate. Ortho Exam Bilateral wrist radial pulses 2+ and equal symmetric.  Subjective decreased sensation right hand over the median nerve distribution.  Full motor bilateral hands.  5 out of 5 strength throughout the upper extremities against resistance.  Good range of motion cervical spine without significant pain.  Positive Spurling's.  Tenderness over the medial borders of both scapula. Negative Tinel's over the median nerve at the wrist bilaterally.  Negative compression test over the median nerve at the wrist bilaterally.  Phalen's is negative bilaterally.  Negative Tinel's over the ulnar nerve at the elbow bilaterally. Specialty Comments:  No specialty comments available.  Imaging: XR Cervical Spine 2 or 3 views  Result Date: 03/29/2021 Cervical spine 2 views: Loss of lordotic curvature.  Disc space overall well-maintained.  No spondylolisthesis no acute fractures no bony abnormalities.  XR Shoulder Right  Result Date: 03/29/2021 Right shoulder 3 views: Shoulders well located.  Subacromial space is well maintained.  No acute fractures.  Glenohumeral joint is well-maintained.    PMFS History: Patient Active Problem List   Diagnosis Date Noted  . Chronic neck pain 12/11/2020  . Generalized anxiety disorder 10/08/2020  . COVID-19 vaccine series completed 10/08/2020  . Hydronephrosis of right kidney 12/03/2018  .  GERD (gastroesophageal reflux disease) 04/06/2017   Past Medical History:  Diagnosis Date  . Colitis   . Gastritis   . GERD (gastroesophageal reflux disease)   . Pyelonephritis   . Pyelonephritis     Family History  Problem Relation Age of Onset  . Diabetes Maternal Grandmother     Past Surgical History:  Procedure Laterality Date  . NO PAST SURGERIES     Social History   Occupational History  . Occupation: cleaning  Tobacco Use  . Smoking status: Never Smoker  . Smokeless tobacco: Never Used  Vaping Use  . Vaping Use: Never used  Substance and Sexual Activity  . Alcohol use: No    Alcohol/week: 0.0 standard drinks  . Drug use: No  . Sexual activity: Yes    Birth control/protection: Injection

## 2021-04-27 ENCOUNTER — Other Ambulatory Visit: Payer: Self-pay

## 2021-04-27 ENCOUNTER — Ambulatory Visit: Payer: Self-pay | Attending: Internal Medicine

## 2021-04-27 DIAGNOSIS — Z3042 Encounter for surveillance of injectable contraceptive: Secondary | ICD-10-CM

## 2021-04-27 MED ORDER — MEDROXYPROGESTERONE ACETATE 150 MG/ML IM SUSP
150.0000 mg | Freq: Once | INTRAMUSCULAR | Status: AC
  Administered 2021-04-27: 150 mg via INTRAMUSCULAR

## 2021-04-27 NOTE — Progress Notes (Signed)
Pt came into the office today for depo injection

## 2021-05-14 ENCOUNTER — Other Ambulatory Visit: Payer: Self-pay

## 2021-05-14 ENCOUNTER — Ambulatory Visit (INDEPENDENT_AMBULATORY_CARE_PROVIDER_SITE_OTHER): Payer: Self-pay | Admitting: Physical Medicine and Rehabilitation

## 2021-05-14 ENCOUNTER — Encounter: Payer: Self-pay | Admitting: Physical Medicine and Rehabilitation

## 2021-05-14 DIAGNOSIS — R202 Paresthesia of skin: Secondary | ICD-10-CM

## 2021-05-14 NOTE — Progress Notes (Signed)
Numbness in hands which started on right and is now on left also. Right arm pain. First three fingers on right hand and thumb on left hand are the worst. Right hand dominant +Lotion

## 2021-05-17 NOTE — Procedures (Signed)
EMG & NCV Findings: Evaluation of the left median (across palm) sensory and the right median (across palm) sensory nerves showed prolonged distal peak latency (Palm, L2.1, R2.1 ms).  All remaining nerves (as indicated in the following tables) were within normal limits.  All left vs. right side differences were within normal limits.    All examined muscles (as indicated in the following table) showed no evidence of electrical instability.    Impression: Essentially NORMAL electrodiagnostic study of the right upper limb.  There is no significant electrodiagnostic evidence of nerve entrapment, brachial plexopathy or cervical radiculopathy.    As you know, purely sensory or demyelinating radiculopathies and chemical radiculitis may not be detected with this particular electrodiagnostic study.  Recommendations: 1.  Follow-up with referring physician. 2.  Continue current management of symptoms.  ___________________________ Laurence Spates FAAPMR Board Certified, American Board of Physical Medicine and Rehabilitation    Nerve Conduction Studies Anti Sensory Summary Table   Stim Site NR Peak (ms) Norm Peak (ms) P-T Amp (V) Norm P-T Amp Site1 Site2 Delta-P (ms) Dist (cm) Vel (m/s) Norm Vel (m/s)  Left Median Acr Palm Anti Sensory (2nd Digit)  30.3C  Wrist    3.4 <3.6 54.2 >10 Wrist Palm 1.3 0.0    Palm    *2.1 <2.0 65.0         Right Median Acr Palm Anti Sensory (2nd Digit)  29.7C  Wrist    3.5 <3.6 48.0 >10 Wrist Palm 1.4 0.0    Palm    *2.1 <2.0 57.0         Right Radial Anti Sensory (Base 1st Digit)  29.4C  Wrist    2.4 <3.1 27.3  Wrist Base 1st Digit 2.4 0.0    Right Ulnar Anti Sensory (5th Digit)  29.9C  Wrist    3.3 <3.7 40.3 >15.0 Wrist 5th Digit 3.3 14.0 42 >38   Motor Summary Table   Stim Site NR Onset (ms) Norm Onset (ms) O-P Amp (mV) Norm O-P Amp Site1 Site2 Delta-0 (ms) Dist (cm) Vel (m/s) Norm Vel (m/s)  Left Median Motor (Abd Poll Brev)  30.4C  Wrist    3.3 <4.2 6.7 >5  Elbow Wrist 3.3 19.0 58 >50  Elbow    6.6  6.6         Right Median Motor (Abd Poll Brev)  29.9C  Wrist    3.4 <4.2 6.9 >5 Elbow Wrist 3.5 19.0 54 >50  Elbow    6.9  7.0         Right Ulnar Motor (Abd Dig Min)  30.1C  Wrist    2.5 <4.2 11.4 >3 B Elbow Wrist 3.1 19.0 61 >53  B Elbow    5.6  11.7  A Elbow B Elbow 1.0 10.0 100 >53  A Elbow    6.6  11.4          EMG   Side Muscle Nerve Root Ins Act Fibs Psw Amp Dur Poly Recrt Int Fraser Din Comment  Right Abd Poll Brev Median C8-T1 Nml Nml Nml Nml Nml 0 Nml Nml   Right 1stDorInt Ulnar C8-T1 Nml Nml Nml Nml Nml 0 Nml Nml   Right PronatorTeres Median C6-7 Nml Nml Nml Nml Nml 0 Nml Nml   Right Biceps Musculocut C5-6 Nml Nml Nml Nml Nml 0 Nml Nml   Right Deltoid Axillary C5-6 Nml Nml Nml Nml Nml 0 Nml Nml     Nerve Conduction Studies Anti Sensory Left/Right Comparison   Stim Site L  Lat (ms) R Lat (ms) L-R Lat (ms) L Amp (V) R Amp (V) L-R Amp (%) Site1 Site2 L Vel (m/s) R Vel (m/s) L-R Vel (m/s)  Median Acr Palm Anti Sensory (2nd Digit)  30.3C  Wrist 3.4 3.5 0.1 54.2 48.0 11.4 Wrist Palm     Palm *2.1 *2.1 0.0 65.0 57.0 12.3       Radial Anti Sensory (Base 1st Digit)  29.4C  Wrist  2.4   27.3  Wrist Base 1st Digit     Ulnar Anti Sensory (5th Digit)  29.9C  Wrist  3.3   40.3  Wrist 5th Digit  42    Motor Left/Right Comparison   Stim Site L Lat (ms) R Lat (ms) L-R Lat (ms) L Amp (mV) R Amp (mV) L-R Amp (%) Site1 Site2 L Vel (m/s) R Vel (m/s) L-R Vel (m/s)  Median Motor (Abd Poll Brev)  30.4C  Wrist 3.3 3.4 0.1 6.7 6.9 2.9 Elbow Wrist 58 54 4  Elbow 6.6 6.9 0.3 6.6 7.0 5.7       Ulnar Motor (Abd Dig Min)  30.1C  Wrist  2.5   11.4  B Elbow Wrist  61   B Elbow  5.6   11.7  A Elbow B Elbow  100   A Elbow  6.6   11.4           Waveforms:

## 2021-05-19 NOTE — Progress Notes (Signed)
[facility] Lisa Lin - 35 y.o. Lin MRN [id number]  Date of birth: 1986/06/05  Office Visit Note: Visit Date: 05/14/2021 PCP: Ladell Pier, MD Referred by: Ladell Pier, MD  Subjective: Chief Complaint  Patient presents with   Right Hand - Numbness   Left Hand - Numbness   HPI:  [name] is a 35 y.o. Lin who comes in today at the request of Benita Stabile, PA-C for electrodiagnostic study of the Right upper extremities.  Patient is Right hand dominant.  She complains of chronic worsening neck and right arm pain but also numbness tingling in the right hand particularly in the radial 3 digits.  Starting to get some tingling on the left.  No prior electrodiagnostic studies.  Has failed conservative care otherwise.   ROS Otherwise per HPI.  Assessment & Plan: Visit Diagnoses:    ICD-10-CM   1. Paresthesia of skin  R20.2 NCV with EMG (electromyography)      Plan: Impression: Essentially NORMAL electrodiagnostic study of the right upper limb.  There is no significant electrodiagnostic evidence of nerve entrapment, brachial plexopathy or cervical radiculopathy.    As you know, purely sensory or demyelinating radiculopathies and chemical radiculitis may not be detected with this particular electrodiagnostic study.  Recommendations: 1.  Follow-up with referring physician. 2.  Continue current management of symptoms.  Meds & Orders: No orders of the defined types were placed in this encounter.   Orders Placed This Encounter  Procedures   NCV with EMG (electromyography)    Follow-up: Return in about 2 weeks (around 05/28/2021) for Benita Stabile, P.A.-C.   Procedures: No procedures performed  EMG & NCV Findings: Evaluation of the left median (across palm) sensory and the right median (across palm) sensory nerves showed prolonged distal peak latency (Palm, L2.1, R2.1 ms).  All remaining nerves (as indicated in the following tables) were within normal  limits.  All left vs. right side differences were within normal limits.    All examined muscles (as indicated in the following table) showed no evidence of electrical instability.    Impression: Essentially NORMAL electrodiagnostic study of the right upper limb.  There is no significant electrodiagnostic evidence of nerve entrapment, brachial plexopathy or cervical radiculopathy.    As you know, purely sensory or demyelinating radiculopathies and chemical radiculitis may not be detected with this particular electrodiagnostic study.  Recommendations: 1.  Follow-up with referring physician. 2.  Continue current management of symptoms.  ___________________________ Laurence Spates FAAPMR Board Certified, American Board of Physical Medicine and Rehabilitation    Nerve Conduction Studies Anti Sensory Summary Table   Stim Site NR Peak (ms) Norm Peak (ms) P-T Amp (V) Norm P-T Amp Site1 Site2 Delta-P (ms) Dist (cm) Vel (m/s) Norm Vel (m/s)  Left Median Acr Palm Anti Sensory (2nd Digit)  30.3C  Wrist    3.4 <3.6 54.2 >10 Wrist Palm 1.3 0.0    Palm    *2.1 <2.0 65.0         Right Median Acr Palm Anti Sensory (2nd Digit)  29.7C  Wrist    3.5 <3.6 48.0 >10 Wrist Palm 1.4 0.0    Palm    *2.1 <2.0 57.0         Right Radial Anti Sensory (Base 1st Digit)  29.4C  Wrist    2.4 <3.1 27.3  Wrist Base 1st Digit 2.4 0.0    Right Ulnar Anti Sensory (5th Digit)  29.9C  Wrist    3.3 <3.7  40.3 >15.0 Wrist 5th Digit 3.3 14.0 42 >38   Motor Summary Table   Stim Site NR Onset (ms) Norm Onset (ms) O-P Amp (mV) Norm O-P Amp Site1 Site2 Delta-0 (ms) Dist (cm) Vel (m/s) Norm Vel (m/s)  Left Median Motor (Abd Poll Brev)  30.4C  Wrist    3.3 <4.2 6.7 >5 Elbow Wrist 3.3 19.0 58 >50  Elbow    6.6  6.6         Right Median Motor (Abd Poll Brev)  29.9C  Wrist    3.4 <4.2 6.9 >5 Elbow Wrist 3.5 19.0 54 >50  Elbow    6.9  7.0         Right Ulnar Motor (Abd Dig Min)  30.1C  Wrist    2.5 <4.2 11.4 >3 B Elbow  Wrist 3.1 19.0 61 >53  B Elbow    5.6  11.7  A Elbow B Elbow 1.0 10.0 100 >53  A Elbow    6.6  11.4          EMG   Side Muscle Nerve Root Ins Act Fibs Psw Amp Dur Poly Recrt Int Fraser Din Comment  Right Abd Poll Brev Median C8-T1 Nml Nml Nml Nml Nml 0 Nml Nml   Right 1stDorInt Ulnar C8-T1 Nml Nml Nml Nml Nml 0 Nml Nml   Right PronatorTeres Median C6-7 Nml Nml Nml Nml Nml 0 Nml Nml   Right Biceps Musculocut C5-6 Nml Nml Nml Nml Nml 0 Nml Nml   Right Deltoid Axillary C5-6 Nml Nml Nml Nml Nml 0 Nml Nml     Nerve Conduction Studies Anti Sensory Left/Right Comparison   Stim Site L Lat (ms) R Lat (ms) L-R Lat (ms) L Amp (V) R Amp (V) L-R Amp (%) Site1 Site2 L Vel (m/s) R Vel (m/s) L-R Vel (m/s)  Median Acr Palm Anti Sensory (2nd Digit)  30.3C  Wrist 3.4 3.5 0.1 54.2 48.0 11.4 Wrist Palm     Palm *2.1 *2.1 0.0 65.0 57.0 12.3       Radial Anti Sensory (Base 1st Digit)  29.4C  Wrist  2.4   27.3  Wrist Base 1st Digit     Ulnar Anti Sensory (5th Digit)  29.9C  Wrist  3.3   40.3  Wrist 5th Digit  42    Motor Left/Right Comparison   Stim Site L Lat (ms) R Lat (ms) L-R Lat (ms) L Amp (mV) R Amp (mV) L-R Amp (%) Site1 Site2 L Vel (m/s) R Vel (m/s) L-R Vel (m/s)  Median Motor (Abd Poll Brev)  30.4C  Wrist 3.3 3.4 0.1 6.7 6.9 2.9 Elbow Wrist 58 54 4  Elbow 6.6 6.9 0.3 6.6 7.0 5.7       Ulnar Motor (Abd Dig Min)  30.1C  Wrist  2.5   11.4  B Elbow Wrist  61   B Elbow  5.6   11.7  A Elbow B Elbow  100   A Elbow  6.6   11.4           Waveforms:                Clinical History: No specialty comments available.     Objective:  VS:  HT:    WT:   BMI:     BP:   HR: bpm  TEMP: ( )  RESP:  Physical Exam Musculoskeletal:        General: No swelling, tenderness or deformity.     Comments: Inspection  reveals no atrophy of the bilateral APB or FDI or hand intrinsics. There is no swelling, color changes, allodynia or dystrophic changes. There is 5 out of 5 strength in the bilateral  wrist extension, finger abduction and long finger flexion. There is intact sensation to light touch in all dermatomal and peripheral nerve distributions.  There is a negative Hoffmann's test bilaterally.  Skin:    General: Skin is warm and dry.     Findings: No erythema or rash.  Neurological:     General: No focal deficit present.     Mental Status: She is alert and oriented to person, place, and time.     Motor: No weakness or abnormal muscle tone.     Coordination: Coordination normal.  Psychiatric:        Mood and Affect: Mood normal.        Behavior: Behavior normal.     Imaging: No results found.

## 2021-05-27 ENCOUNTER — Ambulatory Visit (INDEPENDENT_AMBULATORY_CARE_PROVIDER_SITE_OTHER): Payer: Self-pay | Admitting: Orthopaedic Surgery

## 2021-05-27 ENCOUNTER — Encounter: Payer: Self-pay | Admitting: Orthopaedic Surgery

## 2021-05-27 DIAGNOSIS — M79641 Pain in right hand: Secondary | ICD-10-CM

## 2021-05-27 DIAGNOSIS — M79642 Pain in left hand: Secondary | ICD-10-CM

## 2021-05-27 MED ORDER — MELOXICAM 7.5 MG PO TABS: 7.5000 mg | ORAL_TABLET | Freq: Two times a day (BID) | ORAL | 3 refills | Status: DC | PRN

## 2021-05-27 NOTE — Progress Notes (Signed)
The patient comes in today with an interpreter to go over right upper extremity nerve conduction studies.  She is 35 years old.  She was having numbness and tingling in the median nerve distribution as well as pain in her thumb and index and middle finger on the right hand and some of the thumb on the left hand.  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  The nerve conduction studies were reviewed with her.  There was no evidence of any carpal tunnel or cubital tunnel syndrome or compressive neuropathy.  These were normal studies.  On examination of her hands, there is no evidence of muscle atrophy.  There is pain in the joints of her fingers but no swelling and she has good pinch strength and grip strength.  She has been on 600 mg of ibuprofen as needed and this is helped.  I think is worth trying a completely different anti-inflammatory so I will prescribe meloxicam for her and recommended over-the-counter Voltaren gel to try 2-3 times daily on the joints of her fingers.  There is really no other treatment I would recommend.  This is been going on for 3 months and certainly she can try Tylenol arthritis as well.  All questions and concerns were answered and addressed.  Follow-up is as needed.

## 2021-06-28 ENCOUNTER — Other Ambulatory Visit: Payer: Self-pay

## 2021-06-28 ENCOUNTER — Encounter: Payer: Self-pay | Admitting: Internal Medicine

## 2021-06-28 ENCOUNTER — Ambulatory Visit: Payer: Self-pay | Attending: Internal Medicine | Admitting: Internal Medicine

## 2021-06-28 DIAGNOSIS — K219 Gastro-esophageal reflux disease without esophagitis: Secondary | ICD-10-CM

## 2021-06-28 DIAGNOSIS — G8929 Other chronic pain: Secondary | ICD-10-CM

## 2021-06-28 DIAGNOSIS — M542 Cervicalgia: Secondary | ICD-10-CM

## 2021-06-28 DIAGNOSIS — F411 Generalized anxiety disorder: Secondary | ICD-10-CM

## 2021-06-28 MED ORDER — MELOXICAM 7.5 MG PO TABS
7.5000 mg | ORAL_TABLET | Freq: Two times a day (BID) | ORAL | 3 refills | Status: AC | PRN
  Filled 2021-06-28: qty 60, 30d supply, fill #0
  Filled 2021-08-02: qty 60, 30d supply, fill #1
  Filled 2021-12-10: qty 60, 30d supply, fill #0

## 2021-06-28 MED ORDER — BUSPIRONE HCL 10 MG PO TABS
ORAL_TABLET | Freq: Two times a day (BID) | ORAL | 4 refills | Status: AC
  Filled 2021-06-28: qty 60, 30d supply, fill #0
  Filled 2021-08-02: qty 60, 30d supply, fill #1
  Filled 2021-12-10: qty 60, 30d supply, fill #0

## 2021-06-28 NOTE — Progress Notes (Signed)
Patient ID: Lisa Lin, female   DOB: [birth year], 35 y.o.   MRN: [id number] Virtual Visit via Telephone Note  I connected with [facility] Lisa Lin on 06/28/2021 at 8:34 AM by telephone and verified that I am speaking with the correct person using two identifiers  Location: Patient: home Provider: office  Participants: Myself Patient Lisa Lin interpreter: [phone], Imagene Gurney   I discussed the limitations, risks, security and privacy concerns of performing an evaluation and management service by telephone and the availability of in person appointments. I also discussed with the patient that there may be a patient responsible charge related to this service. The patient expressed understanding and agreed to proceed.   History of Present Illness: Pt with hx of GERD, GAD, chronic RT sided neck pain. Last eval by me 01/2021.  Today's visit is for chronic ds management.  GERD:  doing well on Omeprazole.  GAD: doing well on Buspar.  Request RF.  Saw Dr. Ninfa Linden and Ernestina Patches for paresthesia RUE and neck pain.  EMG negative.  She had x-rays of the cervical spine in April of this year.  Disc space overall well-maintained.  No spondylolisthesis, no acute fractures, no bony abnormalities were seen.  Ibuprofen was changed to meloxicam as needed.  Patient states that she still gets pain.   Outpatient Encounter Medications as of 06/28/2021  Medication Sig   busPIRone (BUSPAR) 10 MG tablet TAKE 1 TABLET (10 MG TOTAL) BY MOUTH 2 (TWO) TIMES DAILY.   ibuprofen (ADVIL) 600 MG tablet Take 1 tablet (600 mg total) by mouth every 8 (eight) hours as needed for moderate pain.   meloxicam (MOBIC) 7.5 MG tablet Take 1 tablet (7.5 mg total) by mouth 2 (two) times daily as needed for pain.   methocarbamol (ROBAXIN) 500 MG tablet TAKE 1 TABLET (500 MG TOTAL) BY MOUTH 2 (TWO) TIMES DAILY AS NEEDED FOR MUSCLE SPASMS.   omeprazole (PRILOSEC) 20 MG capsule Take 1 capsule (20 mg total) by mouth daily.   No  facility-administered encounter medications on file as of 06/28/2021.      Observations/Objective: No direct observation done as this was a telephone encounter.  Assessment and Plan: 1. Chronic neck pain Advised patient that she can take meloxicam twice a day instead of as needed. - meloxicam (MOBIC) 7.5 MG tablet; Take 1 tablet (7.5 mg total) by mouth 2 (two) times daily as needed for pain.  Dispense: 60 tablet; Refill: 3  2. Generalized anxiety disorder Doing well on the medication.  Refill given on BuSpar. - busPIRone (BUSPAR) 10 MG tablet; TAKE 1 TABLET (10 MG TOTAL) BY MOUTH 2 (TWO) TIMES DAILY.  Dispense: 60 tablet; Refill: 4  3. Gastroesophageal reflux disease without esophagitis Continue omeprazole.   Follow Up Instructions: 5 mths   I discussed the assessment and treatment plan with the patient. The patient was provided an opportunity to ask questions and all were answered. The patient agreed with the plan and demonstrated an understanding of the instructions.   The patient was advised to call back or seek an in-person evaluation if the symptoms worsen or if the condition fails to improve as anticipated.  I  Spent 9 minutes on this telephone encounter  Karle Plumber, MD

## 2021-06-29 ENCOUNTER — Other Ambulatory Visit: Payer: Self-pay

## 2021-07-13 ENCOUNTER — Other Ambulatory Visit: Payer: Self-pay

## 2021-07-13 ENCOUNTER — Ambulatory Visit: Payer: Self-pay | Attending: Internal Medicine

## 2021-07-13 DIAGNOSIS — Z3042 Encounter for surveillance of injectable contraceptive: Secondary | ICD-10-CM

## 2021-07-13 MED ORDER — MEDROXYPROGESTERONE ACETATE 150 MG/ML IM SUSP
150.0000 mg | Freq: Once | INTRAMUSCULAR | Status: AC
  Administered 2021-07-13: 150 mg via INTRAMUSCULAR

## 2021-07-13 NOTE — Progress Notes (Signed)
Pt arrived for depo injection Pt on time no urine HCG needed Pt given injection in left ventrogluteal  Pt tolerated injection well Pt to return [id number]

## 2021-08-02 ENCOUNTER — Other Ambulatory Visit: Payer: Self-pay

## 2021-09-14 ENCOUNTER — Ambulatory Visit: Payer: Self-pay | Admitting: Internal Medicine

## 2021-09-28 ENCOUNTER — Ambulatory Visit: Payer: Self-pay | Attending: Internal Medicine

## 2021-09-28 ENCOUNTER — Other Ambulatory Visit: Payer: Self-pay

## 2021-09-28 DIAGNOSIS — Z3042 Encounter for surveillance of injectable contraceptive: Secondary | ICD-10-CM

## 2021-09-28 MED ORDER — MEDROXYPROGESTERONE ACETATE 150 MG/ML IM SUSP
150.0000 mg | Freq: Once | INTRAMUSCULAR | Status: AC
Start: 2021-09-28 — End: 2021-09-28
  Administered 2021-09-28: 150 mg via INTRAMUSCULAR

## 2021-09-28 NOTE — Progress Notes (Signed)
Pt arrived for depo injection Pt on time no urine HCG needed Pt given injection in RIGHT ventrogluteal  Pt tolerated injection well Pt to return [id number]

## 2021-12-10 ENCOUNTER — Other Ambulatory Visit (HOSPITAL_COMMUNITY): Payer: Self-pay

## 2021-12-10 ENCOUNTER — Other Ambulatory Visit: Payer: Self-pay

## 2021-12-15 ENCOUNTER — Ambulatory Visit: Payer: Self-pay | Attending: Internal Medicine

## 2021-12-15 ENCOUNTER — Other Ambulatory Visit: Payer: Self-pay

## 2021-12-15 DIAGNOSIS — Z3042 Encounter for surveillance of injectable contraceptive: Secondary | ICD-10-CM

## 2021-12-15 MED ORDER — MEDROXYPROGESTERONE ACETATE 150 MG/ML IM SUSP
150.0000 mg | Freq: Once | INTRAMUSCULAR | Status: AC
  Administered 2021-12-15: 150 mg via INTRAMUSCULAR

## 2021-12-15 NOTE — Progress Notes (Signed)
Pt arrived for depo injection Pt on time no urine HCG needed Pt given injection in LEFT ventrogluteal  Pt tolerated injection well Pt to return [id number]

## 2022-03-02 ENCOUNTER — Ambulatory Visit: Payer: Self-pay | Attending: Internal Medicine

## 2022-03-02 ENCOUNTER — Other Ambulatory Visit: Payer: Self-pay

## 2022-03-02 DIAGNOSIS — Z3042 Encounter for surveillance of injectable contraceptive: Secondary | ICD-10-CM

## 2022-03-02 MED ORDER — MEDROXYPROGESTERONE ACETATE 150 MG/ML IM SUSP
150.0000 mg | Freq: Once | INTRAMUSCULAR | Status: AC
  Administered 2022-03-02: 150 mg via INTRAMUSCULAR

## 2022-03-02 NOTE — Progress Notes (Signed)
Pt arrived for depo injection ?Pt on time no urine HCG needed ?Pt given injection in RIGHT ventrogluteal  ?Pt tolerated injection well ?Pt to return JUN 14- JUN 28 ?

## 2022-05-18 ENCOUNTER — Ambulatory Visit: Payer: Self-pay
